# Patient Record
Sex: Male | Born: 1980
Health system: Southern US, Community
[De-identification: ages and names within clinical notes are randomized; demographics above are authoritative.]

## PROBLEM LIST (undated history)

## (undated) DIAGNOSIS — M545 Low back pain, unspecified: Secondary | ICD-10-CM

## (undated) DIAGNOSIS — M51369 Other intervertebral disc degeneration, lumbar region without mention of lumbar back pain or lower extremity pain: Secondary | ICD-10-CM

## (undated) DIAGNOSIS — R002 Palpitations: Secondary | ICD-10-CM

## (undated) DIAGNOSIS — Z87442 Personal history of urinary calculi: Secondary | ICD-10-CM

## (undated) DIAGNOSIS — E668 Other obesity: Secondary | ICD-10-CM

## (undated) DIAGNOSIS — G8929 Other chronic pain: Secondary | ICD-10-CM

## (undated) DIAGNOSIS — E785 Hyperlipidemia, unspecified: Secondary | ICD-10-CM

## (undated) DIAGNOSIS — K219 Gastro-esophageal reflux disease without esophagitis: Secondary | ICD-10-CM

## (undated) DIAGNOSIS — E669 Obesity, unspecified: Secondary | ICD-10-CM

## (undated) DIAGNOSIS — G4733 Obstructive sleep apnea (adult) (pediatric): Secondary | ICD-10-CM

## (undated) DIAGNOSIS — M5126 Other intervertebral disc displacement, lumbar region: Secondary | ICD-10-CM

## (undated) DIAGNOSIS — Z9989 Dependence on other enabling machines and devices: Secondary | ICD-10-CM

## (undated) DIAGNOSIS — M109 Gout, unspecified: Secondary | ICD-10-CM

## (undated) DIAGNOSIS — G43909 Migraine, unspecified, not intractable, without status migrainosus: Secondary | ICD-10-CM

## (undated) DIAGNOSIS — M5136 Other intervertebral disc degeneration, lumbar region: Secondary | ICD-10-CM

## (undated) DIAGNOSIS — I493 Ventricular premature depolarization: Secondary | ICD-10-CM

## (undated) DIAGNOSIS — J301 Allergic rhinitis due to pollen: Secondary | ICD-10-CM

## (undated) HISTORY — DX: Obstructive sleep apnea (adult) (pediatric): G47.33

## (undated) HISTORY — DX: Other intervertebral disc degeneration, lumbar region: M51.36

## (undated) HISTORY — DX: Low back pain: M54.5

## (undated) HISTORY — DX: Allergic rhinitis due to pollen: J30.1

## (undated) HISTORY — DX: Other obesity: E66.8

## (undated) HISTORY — DX: Low back pain, unspecified: M54.50

## (undated) HISTORY — DX: Other intervertebral disc degeneration, lumbar region without mention of lumbar back pain or lower extremity pain: M51.369

## (undated) HISTORY — DX: Other intervertebral disc displacement, lumbar region: M51.26

## (undated) HISTORY — PX: WISDOM TOOTH EXTRACTION: SHX21

## (undated) HISTORY — DX: Dependence on other enabling machines and devices: Z99.89

## (undated) HISTORY — DX: Other chronic pain: G89.29

## (undated) HISTORY — DX: Personal history of urinary calculi: Z87.442

## (undated) HISTORY — DX: Palpitations: R00.2

## (undated) HISTORY — DX: Gout, unspecified: M10.9

## (undated) HISTORY — DX: Obesity, unspecified: E66.9

## (undated) HISTORY — DX: Hyperlipidemia, unspecified: E78.5

---
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
## 2011-09-22 ENCOUNTER — Encounter: Payer: Self-pay | Admitting: Internal Medicine

## 2011-09-22 ENCOUNTER — Ambulatory Visit (INDEPENDENT_AMBULATORY_CARE_PROVIDER_SITE_OTHER): Payer: BC Managed Care – PPO | Admitting: Internal Medicine

## 2011-09-22 VITALS — BP 112/80 | HR 68 | Temp 98.2°F | Ht 69.0 in | Wt 326.0 lb

## 2011-09-22 DIAGNOSIS — Z23 Encounter for immunization: Secondary | ICD-10-CM

## 2011-09-22 DIAGNOSIS — M545 Low back pain, unspecified: Secondary | ICD-10-CM | POA: Insufficient documentation

## 2011-09-22 DIAGNOSIS — J301 Allergic rhinitis due to pollen: Secondary | ICD-10-CM

## 2011-09-22 DIAGNOSIS — E669 Obesity, unspecified: Secondary | ICD-10-CM | POA: Insufficient documentation

## 2011-09-22 DIAGNOSIS — G8929 Other chronic pain: Secondary | ICD-10-CM

## 2011-09-22 DIAGNOSIS — E785 Hyperlipidemia, unspecified: Secondary | ICD-10-CM

## 2011-09-22 NOTE — Progress Notes (Signed)
Subjective:    Patient ID: Elijah Logan, male    DOB: 11-21-1980, 31 y.o.   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 atorvastatin (LIPITOR) 20 MG tablet Take 20 mg by mouth daily.        No Known Allergies  Past Medical History  Diagnosis Date  . Hyperlipidemia   . Obesity   . Chronic low back pain     Has spina bifida occulta    Past Surgical History  Procedure Date  . Open anterior shoulder reconstruction 1998    Right    Family History  Problem Relation Age of Onset  . Diabetes Mother     borderline  . Diabetes Father     borderline  . Diabetes Maternal Grandmother   . Diabetes Paternal Grandmother   . Hypertension Other   . Stroke Other   . Cancer Other     prostate cancer    History   Social History  . Marital Status: Married    Spouse Name: N/A    Number of Children: 1  . Years of Education: N/A   Occupational History  . 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 Smoking status: Never Smoker   . Smokeless tobacco: Never Used  . Alcohol Use: Yes  . Drug Use: No  . Sexually Active: Not on file   Other Topics Concern  . Not on file   Social History Narrative  . No narrative on file   Review of Systems  Constitutional: Negative for fatigue.       Wears seat belt Weight down with efforts  HENT: Positive for  congestion and rhinorrhea. Negative for hearing loss.        Occ very mild allergy symptoms Uses otc loratadine prn  Eyes: Negative for visual disturbance.  Respiratory: Negative for cough, chest tightness and shortness of breath.   Cardiovascular: Negative for chest pain, palpitations and leg swelling.  Gastrointestinal: Negative for abdominal pain, constipation and blood in stool.  Genitourinary: Negative for frequency and difficulty urinating.  Neurological: Positive for headaches. Negative for dizziness, syncope and light-headedness.       Rare migraines History of concussions while he played football  Psychiatric/Behavioral: Positive for sleep disturbance.       Wife notes increased snoring and is concerned that he has some apnea Tired in AM after 6-7 hours of sleep--feels good after 30 minutes or so Doesn't note sleep pressure       Objective:   Physical Exam  Constitutional: He appears well-developed and well-nourished. No distress.  Neck: Normal range of motion. Neck supple. No thyromegaly present.  Cardiovascular: Normal rate, regular rhythm, normal heart sounds and intact distal pulses.  Exam reveals no gallop.   No murmur heard. Pulmonary/Chest: Effort normal and breath sounds normal. No respiratory distress. He has no wheezes. He has no rales.  Abdominal: Soft. There  is no tenderness.  Musculoskeletal: He exhibits no edema and no tenderness.  Lymphadenopathy:    He has no cervical adenopathy.  Skin: No rash noted. No erythema.       Prominent scar at biopsy site on right arm  Psychiatric: He has a normal mood and affect. His behavior is normal. Thought content normal.          Assessment & Plan:

## 2011-09-22 NOTE — Assessment & Plan Note (Signed)
Discussed concerns about DM given FH He has started weight watchers and more exercise

## 2011-09-22 NOTE — Assessment & Plan Note (Signed)
Has been okay of late Will be working on better fitness

## 2011-09-22 NOTE — Assessment & Plan Note (Signed)
Mild does well with OTC loratadine

## 2011-09-22 NOTE — Assessment & Plan Note (Addendum)
Discussed primary prevention Has worked on diet and exercise No compelling FH of CAD After going over pros and cons---will stop the atorvastatin for now  Prior records reviewed

## 2012-09-28 ENCOUNTER — Encounter: Payer: BC Managed Care – PPO | Admitting: Internal Medicine

## 2012-09-29 ENCOUNTER — Ambulatory Visit (INDEPENDENT_AMBULATORY_CARE_PROVIDER_SITE_OTHER): Payer: BC Managed Care – PPO | Admitting: Internal Medicine

## 2012-09-29 ENCOUNTER — Encounter: Payer: Self-pay | Admitting: Internal Medicine

## 2012-09-29 VITALS — BP 118/70 | HR 82 | Temp 97.9°F | Ht 70.0 in | Wt 330.0 lb

## 2012-09-29 DIAGNOSIS — L909 Atrophic disorder of skin, unspecified: Secondary | ICD-10-CM

## 2012-09-29 DIAGNOSIS — G4730 Sleep apnea, unspecified: Secondary | ICD-10-CM

## 2012-09-29 DIAGNOSIS — L918 Other hypertrophic disorders of the skin: Secondary | ICD-10-CM | POA: Insufficient documentation

## 2012-09-29 DIAGNOSIS — G4733 Obstructive sleep apnea (adult) (pediatric): Secondary | ICD-10-CM | POA: Insufficient documentation

## 2012-09-29 DIAGNOSIS — Z0000 Encounter for general adult medical examination without abnormal findings: Secondary | ICD-10-CM | POA: Insufficient documentation

## 2012-09-29 NOTE — Assessment & Plan Note (Signed)
Wife has noted cessation of breathing Will make referral for evaluation

## 2012-09-29 NOTE — Assessment & Plan Note (Signed)
30 seconds x 2 for each 45 seconds x 2 for the elbow warts Discussed home care

## 2012-09-29 NOTE — Assessment & Plan Note (Signed)
Healthy but out of shape still Discussed fitness DASH diet Reviewed full labs from work ---all fine

## 2012-09-29 NOTE — Patient Instructions (Signed)
DASH Diet  The DASH diet stands for "Dietary Approaches to Stop Hypertension." It is a healthy eating plan that has been shown to reduce high blood pressure (hypertension) in as little as 14 days, while also possibly providing other significant health benefits. These other health benefits include reducing the risk of breast cancer after menopause and reducing the risk of type 2 diabetes, heart disease, colon cancer, and stroke. Health benefits also include weight loss and slowing kidney failure in patients with chronic kidney disease.   DIET GUIDELINES  · Limit salt (sodium). Your diet should contain less than 1500 mg of sodium daily.  · Limit refined or processed carbohydrates. Your diet should include mostly whole grains. Desserts and added sugars should be used sparingly.  · Include small amounts of heart-healthy fats. These types of fats include nuts, oils, and tub margarine. Limit saturated and trans fats. These fats have been shown to be harmful in the body.  CHOOSING FOODS   The following food groups are based on a 2000 calorie diet. See your Registered Dietitian for individual calorie needs.  Grains and Grain Products (6 to 8 servings daily)  · Eat More Often: Whole-wheat bread, brown rice, whole-grain or wheat pasta, quinoa, popcorn without added fat or salt (air popped).  · Eat Less Often: White bread, white pasta, white rice, cornbread.  Vegetables (4 to 5 servings daily)  · Eat More Often: Fresh, frozen, and canned vegetables. Vegetables may be raw, steamed, roasted, or grilled with a minimal amount of fat.  · Eat Less Often/Avoid: Creamed or fried vegetables. Vegetables in a cheese sauce.  Fruit (4 to 5 servings daily)  · Eat More Often: All fresh, canned (in natural juice), or frozen fruits. Dried fruits without added sugar. One hundred percent fruit juice (½ cup [237 mL] daily).  · Eat Less Often: Dried fruits with added sugar. Canned fruit in light or heavy syrup.  Lean Meats, Fish, and Poultry (2  servings or less daily. One serving is 3 to 4 oz [85-114 g]).  · Eat More Often: Ninety percent or leaner ground beef, tenderloin, sirloin. Round cuts of beef, chicken breast, turkey breast. All fish. Grill, bake, or broil your meat. Nothing should be fried.  · Eat Less Often/Avoid: Fatty cuts of meat, turkey, or chicken leg, thigh, or wing. Fried cuts of meat or fish.  Dairy (2 to 3 servings)  · Eat More Often: Low-fat or fat-free milk, low-fat plain or light yogurt, reduced-fat or part-skim cheese.  · Eat Less Often/Avoid: Milk (whole, 2%). Whole milk yogurt. Full-fat cheeses.  Nuts, Seeds, and Legumes (4 to 5 servings per week)  · Eat More Often: All without added salt.  · Eat Less Often/Avoid: Salted nuts and seeds, canned beans with added salt.  Fats and Sweets (limited)  · Eat More Often: Vegetable oils, tub margarines without trans fats, sugar-free gelatin. Mayonnaise and salad dressings.  · Eat Less Often/Avoid: Coconut oils, palm oils, butter, stick margarine, cream, half and half, cookies, candy, pie.  FOR MORE INFORMATION  The Dash Diet Eating Plan: www.dashdiet.org  Document Released: 04/08/2011 Document Revised: 07/12/2011 Document Reviewed: 04/08/2011  ExitCare® Patient Information ©2014 [employer].

## 2012-09-29 NOTE — Progress Notes (Signed)
Subjective:    Patient ID: Elijah Logan, male    DOB: 1980-08-24, 32 y.o.   MRN: [id number]  HPI Here for physical Still with [employer] Just finished building a house Dad just had major MI-- got stents at [facility] (needed to be defibrillated) Different job position with more stress---now trying to reduce time at work  Wife concerned about sleep apnea He feels he sleeps fine She notes some apnea when on his back Does feel tired in the daytime at times---doesn't fall asleep at desk and no sig sleep pressure in car Some snoring but no gasping  Current Outpatient Prescriptions on File Prior to Visit  Medication Sig Dispense Refill  . FIBER FORMULA CAPS Take 2 capsules by mouth daily.      . Omega-3 Fatty Acids (FISH OIL) 1200 MG CAPS Take 3 capsules by mouth daily.       No current facility-administered medications on file prior to visit.    No Known Allergies  Past Medical History  Diagnosis Date  . Hyperlipidemia   . Obesity   . Chronic low back pain     Has spina bifida occulta  . Allergic rhinitis due to pollen     mild    Past Surgical History  Procedure Laterality Date  . Open anterior shoulder reconstruction  1998    Right    Family History  Problem Relation Age of Onset  . Diabetes Mother     borderline  . Diabetes Father     borderline  . Heart disease Father     MI and needed defibrillation  . Diabetes Maternal Grandmother   . Diabetes Paternal Grandmother   . Hypertension Other   . Stroke Other   . Cancer Other     prostate cancer    History   Social History  . Marital Status: Married    Spouse Name: N/A    Number of Children: 1  . Years of Education: N/A   Occupational History  . 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 Smoking status: Never Smoker   . Smokeless tobacco: Never Used  . Alcohol Use: Yes  . Drug Use: No  . Sexually Active: Not on file    Other Topics Concern  . Not on file   Social History Narrative  . No narrative on file   Review of Systems  Constitutional: Negative for unexpected weight change.       Wears seat belt  HENT: Positive for congestion and rhinorrhea. Negative for hearing loss, dental problem and tinnitus.        Regular with dentist  Eyes: Negative for visual disturbance.       No diplopia or unilateral vision loss  Gastrointestinal: Negative for nausea, vomiting, abdominal pain, constipation and blood in stool.       Gets a lot of gas Bowels regular--may be frequent. ??lactose intolerance  Endocrine: Negative for cold intolerance and heat intolerance.  Musculoskeletal: Positive for back pain and arthralgias. Negative for joint swelling.       Some right knee pain after a minor injury--seems to have improved  Skin: Negative for rash.       Red mole on right arm Some skin tags  Allergic/Immunologic: Positive for environmental allergies. Negative for immunocompromised state.       Worse troubles in rental home---loratadine helps  Neurological: Positive for numbness. Negative for dizziness,  syncope, weakness, light-headedness and headaches.       Occ brief right upper buttock numbness--relates to back  Psychiatric/Behavioral: Positive for sleep disturbance. Negative for dysphoric mood. The patient is nervous/anxious.        Stress with work       Objective:   Physical Exam  Constitutional: He is oriented to person, place, and time. He appears well-developed and well-nourished. No distress.  HENT:  Head: Normocephalic and atraumatic.  Right Ear: External ear normal.  Left Ear: External ear normal.  Mouth/Throat: Oropharynx is clear and moist. No oropharyngeal exudate.  Eyes: Conjunctivae and EOM are normal. Pupils are equal, round, and reactive to light.  Neck: Normal range of motion. Neck supple. No thyromegaly present.  Cardiovascular: Normal rate, regular rhythm, normal heart sounds and  intact distal pulses.  Exam reveals no gallop.   No murmur heard. Pulmonary/Chest: Effort normal and breath sounds normal. No respiratory distress. He has no wheezes. He has no rales.  Abdominal: Soft. Bowel sounds are normal. There is no tenderness.  Musculoskeletal: He exhibits no edema and no tenderness.  Lymphadenopathy:    He has no cervical adenopathy.  Neurological: He is alert and oriented to person, place, and time.  Skin: No rash noted.  Small skin tags on right neck and left axilla 2 warts on left elbow  Psychiatric: He has a normal mood and affect. His behavior is normal.          Assessment & Plan:

## 2012-10-23 ENCOUNTER — Institutional Professional Consult (permissible substitution): Payer: BC Managed Care – PPO | Admitting: Pulmonary Disease

## 2012-10-30 ENCOUNTER — Encounter: Payer: Self-pay | Admitting: Pulmonary Disease

## 2012-10-30 ENCOUNTER — Ambulatory Visit (INDEPENDENT_AMBULATORY_CARE_PROVIDER_SITE_OTHER): Payer: BC Managed Care – PPO | Admitting: Pulmonary Disease

## 2012-10-30 VITALS — BP 132/84 | HR 89 | Temp 97.6°F | Ht 71.0 in | Wt 332.0 lb

## 2012-10-30 DIAGNOSIS — G4730 Sleep apnea, unspecified: Secondary | ICD-10-CM

## 2012-10-30 NOTE — Patient Instructions (Addendum)
Will schedule for home sleep testing, and will arrange followup once the results are available.  Work on weight loss  

## 2012-10-30 NOTE — Progress Notes (Signed)
Subjective:    Patient ID: Elijah Logan, male    DOB: [birth year], 32 y.o.   MRN: [id number]  HPI The patient is a 32 year old male who I have been asked to see for possible obstructive sleep apnea.  The patient has been noted by his wife to have snoring, as well as his abnormal breathing pattern during sleep.  The patient denies frequent awakenings, but is only rested about 50% of the mornings when he arises.  He does have daytime sleep pressure with periods of inactivity, but this primarily occurs in the afternoons.  He also will fall asleep easily in the evenings watching television or movies.  He denies any sleepiness issues with driving.  The patient's weight is up 25 pounds over the last 2 years, and his epworth score today is 3.    Sleep Questionnaire What time do you typically go to bed?( Between what hours) 10-11 10-11 at 0900 on 10/30/12 by Nita Sells, CMA How long does it take you to fall asleep? 10-37mins 10-85mins at 0900 on 10/30/12 by Nita Sells, CMA How many times during the night do you wake up? 1 1 at 0900 on 10/30/12 by Nita Sells, CMA What time do you get out of bed to start your day? 0600 0600 at 0900 on 10/30/12 by Nita Sells, CMA Do you drive or operate heavy machinery in your occupation? No No at 0900 on 10/30/12 by Nita Sells, CMA How much has your weight changed (up or down) over the past two years? (In pounds) 25 lb (11.34 kg)25 lb (11.34 kg) increase at 0900 on 10/30/12 by Nita Sells, CMA Have you ever had a sleep study before? No No at 0900 on 10/30/12 by Marjo Bicker Mabe, CMA Do you currently use CPAP? No No at 0900 on 10/30/12 by Marjo Bicker Mabe, CMA Do you wear oxygen at any time? No No at 0900 on 10/30/12 by Marjo Bicker Mabe, CMA   Review of Systems  Constitutional: Positive for unexpected weight change. Negative for fever.  HENT: Positive for congestion and sneezing. Negative for ear pain, nosebleeds, sore throat, rhinorrhea, trouble  swallowing, dental problem, postnasal drip and sinus pressure.   Eyes: Negative for redness and itching.  Respiratory: Negative for cough, chest tightness, shortness of breath and wheezing.   Cardiovascular: Negative for palpitations and leg swelling.  Gastrointestinal: Negative for nausea and vomiting.       Indigestion  Genitourinary: Negative for dysuria.  Musculoskeletal: Positive for joint swelling and arthralgias.  Skin: Negative for rash.  Neurological: Negative for headaches.  Hematological: Does not bruise/bleed easily.  Psychiatric/Behavioral: Negative for dysphoric mood. The patient is nervous/anxious.        Objective:   Physical Exam Constitutional:  Morbidly obese male , no acute distress  HENT:  Nares patent without discharge  Oropharynx without exudate, palate and uvula are mildly elongated.   Eyes:  Perrla, eomi, no scleral icterus  Neck:  No JVD, no TMG  Cardiovascular:  Normal rate, regular rhythm, no rubs or gallops.  No murmurs        Intact distal pulses  Pulmonary :  Normal breath sounds, no stridor or respiratory distress   No rales, rhonchi, or wheezing  Abdominal:  Soft, nondistended, bowel sounds present.  No tenderness noted.   Musculoskeletal:  No lower extremity edema noted.  Lymph Nodes:  No cervical lymphadenopathy noted  Skin:  No cyanosis noted  Neurologic:  Alert, appropriate, moves all 4 extremities  without obvious deficit.         Assessment & Plan:

## 2012-10-30 NOTE — Assessment & Plan Note (Signed)
The patient's history is very suggestive of obstructive sleep apnea.  He has been noted to have mild snoring, witnessed apneas, and he clearly has nonrestorative sleep.  He is morbidly obese, and has some degree of daytime sleepiness.  At this point, I have recommended that he have a sleep test done, and the patient is agreeable.  Because of his age and lack of comorbid medical issues, I think he would be a good candidate for home sleep testing.

## 2012-11-30 DIAGNOSIS — R0989 Other specified symptoms and signs involving the circulatory and respiratory systems: Secondary | ICD-10-CM

## 2012-11-30 DIAGNOSIS — R0609 Other forms of dyspnea: Secondary | ICD-10-CM

## 2012-11-30 DIAGNOSIS — G479 Sleep disorder, unspecified: Secondary | ICD-10-CM

## 2012-12-15 ENCOUNTER — Telehealth: Payer: Self-pay | Admitting: Pulmonary Disease

## 2012-12-15 ENCOUNTER — Encounter: Payer: Self-pay | Admitting: Pulmonary Disease

## 2012-12-15 DIAGNOSIS — I498 Other specified cardiac arrhythmias: Secondary | ICD-10-CM

## 2012-12-15 DIAGNOSIS — R000 Tachycardia, unspecified: Secondary | ICD-10-CM

## 2012-12-15 DIAGNOSIS — G4733 Obstructive sleep apnea (adult) (pediatric): Secondary | ICD-10-CM

## 2012-12-15 NOTE — Telephone Encounter (Signed)
Elijah Logan, this pt needs ov with me soon to f/u on his sleep study.

## 2012-12-15 NOTE — Telephone Encounter (Signed)
Unable to leave voicemail d/t voicemail being full. LMOM with wife Vicky(EC) When patient calls, please schedule in FIRST AVAILABLE SLOT OR FIRST 430 SLOT

## 2012-12-19 NOTE — Telephone Encounter (Signed)
Pt's wife returned call and scheduled appt for pt 12/29/12 @ 3:30.  Nothing further needed. Leanora Ivanoff

## 2012-12-29 ENCOUNTER — Encounter: Payer: Self-pay | Admitting: Pulmonary Disease

## 2012-12-29 ENCOUNTER — Ambulatory Visit (INDEPENDENT_AMBULATORY_CARE_PROVIDER_SITE_OTHER): Payer: BC Managed Care – PPO | Admitting: Pulmonary Disease

## 2012-12-29 VITALS — BP 122/86 | HR 86 | Temp 98.3°F | Ht 70.5 in | Wt 328.8 lb

## 2012-12-29 DIAGNOSIS — G4733 Obstructive sleep apnea (adult) (pediatric): Secondary | ICD-10-CM

## 2012-12-29 NOTE — Progress Notes (Signed)
  Subjective:    Patient ID: Elijah Logan, male    DOB: 09-08-1980, 32 y.o.   MRN: [id number]  HPI Patient comes in today for followup after his recent home sleep testing.  He was found to have severe obstructive sleep apnea with an AHI of 69 events per hour.  I have reviewed the study with him in detail, and answered all of his questions.   Review of Systems  Constitutional: Negative for fever and unexpected weight change.  HENT: Negative for ear pain, nosebleeds, congestion, sore throat, rhinorrhea, sneezing, trouble swallowing, dental problem, postnasal drip and sinus pressure.   Eyes: Negative for redness and itching.  Respiratory: Negative for cough, chest tightness, shortness of breath and wheezing.   Cardiovascular: Negative for palpitations and leg swelling.  Gastrointestinal: Negative for nausea and vomiting.  Genitourinary: Negative for dysuria.  Musculoskeletal: Negative for joint swelling.  Skin: Negative for rash.  Neurological: Negative for headaches.  Hematological: Does not bruise/bleed easily.  Psychiatric/Behavioral: Negative for dysphoric mood. The patient is not nervous/anxious.        Objective:   Physical Exam Morbidly obese male in no acute distress Nose without purulence or discharge noted Neck without lymphadenopathy or thyromegaly Lower extremities with minimal edema, no cyanosis Alert and oriented, moves all 4 extremities.       Assessment & Plan:

## 2012-12-29 NOTE — Assessment & Plan Note (Signed)
The patient has severe obstructive sleep apnea, and will need aggressive treatment with CPAP while he is working on weight loss.  The patient is agreeable to this approach. I will set the patient up on cpap at a moderate pressure level to allow for desensitization, and will troubleshoot the device over the next 4-6weeks if needed.  The pt is to call me if having issues with tolerance.  Will then optimize the pressure once patient is able to wear cpap on a consistent basis.

## 2012-12-29 NOTE — Patient Instructions (Addendum)
Will start you on cpap at a moderate pressure level.  Please call if having issues with tolerance. Work on weight loss followup with me in 6 weeks.

## 2013-02-09 ENCOUNTER — Ambulatory Visit: Payer: BC Managed Care – PPO | Admitting: Pulmonary Disease

## 2013-03-08 ENCOUNTER — Other Ambulatory Visit: Payer: Self-pay

## 2013-03-16 ENCOUNTER — Ambulatory Visit: Payer: BC Managed Care – PPO | Admitting: Pulmonary Disease

## 2013-10-05 ENCOUNTER — Encounter: Payer: BC Managed Care – PPO | Admitting: Internal Medicine

## 2013-10-17 ENCOUNTER — Encounter: Payer: BC Managed Care – PPO | Admitting: Internal Medicine

## 2013-10-22 ENCOUNTER — Encounter: Payer: Self-pay | Admitting: Internal Medicine

## 2013-10-22 ENCOUNTER — Ambulatory Visit (INDEPENDENT_AMBULATORY_CARE_PROVIDER_SITE_OTHER): Payer: BC Managed Care – PPO | Admitting: Internal Medicine

## 2013-10-22 VITALS — BP 120/76 | HR 72 | Temp 98.3°F | Ht 70.0 in | Wt 340.0 lb

## 2013-10-22 DIAGNOSIS — E785 Hyperlipidemia, unspecified: Secondary | ICD-10-CM

## 2013-10-22 DIAGNOSIS — Z0000 Encounter for general adult medical examination without abnormal findings: Secondary | ICD-10-CM

## 2013-10-22 LAB — CBC
HEMATOCRIT: 44.5 % (ref 39.0–52.0)
Hemoglobin: 15 g/dL (ref 13.0–17.0)
MCHC: 33.7 g/dL (ref 30.0–36.0)
MCV: 86.5 fl (ref 78.0–100.0)
Platelets: 235 10*3/uL (ref 150.0–400.0)
RBC: 5.15 Mil/uL (ref 4.22–5.81)
RDW: 13.1 % (ref 11.5–15.5)
WBC: 6.3 10*3/uL (ref 4.0–10.5)

## 2013-10-22 LAB — COMPREHENSIVE METABOLIC PANEL
ALBUMIN: 4.3 g/dL (ref 3.5–5.2)
ALT: 47 U/L (ref 0–53)
AST: 23 U/L (ref 0–37)
Alkaline Phosphatase: 49 U/L (ref 39–117)
BILIRUBIN TOTAL: 0.6 mg/dL (ref 0.2–1.2)
BUN: 14 mg/dL (ref 6–23)
CO2: 30 meq/L (ref 19–32)
Calcium: 9.1 mg/dL (ref 8.4–10.5)
Chloride: 106 mEq/L (ref 96–112)
Creatinine, Ser: 0.9 mg/dL (ref 0.4–1.5)
GFR: 102.24 mL/min (ref >60.00)
GLUCOSE: 99 mg/dL (ref 70–99)
Potassium: 4.8 mEq/L (ref 3.5–5.1)
Sodium: 141 mEq/L (ref 135–145)
TOTAL PROTEIN: 7.2 g/dL (ref 6.0–8.3)

## 2013-10-22 LAB — LIPID PANEL
CHOLESTEROL: 184 mg/dL (ref 0–200)
HDL: 34.9 mg/dL — ABNORMAL LOW (ref >39.00)
LDL Cholesterol: 119 mg/dL — ABNORMAL HIGH (ref 0–99)
NONHDL: 149.1
Total CHOL/HDL Ratio: 5
Triglycerides: 149 mg/dL (ref 0.0–149.0)
VLDL: 29.8 mg/dL (ref 0.0–40.0)

## 2013-10-22 LAB — HEMOGLOBIN A1C: HEMOGLOBIN A1C: 5.8 % (ref 4.6–6.5)

## 2013-10-22 NOTE — Progress Notes (Signed)
Subjective:    Patient ID: Elijah Logan, male    DOB: 25-Jun-1980, 33 y.o.   MRN: [id number]  HPI  Pt presents to the clinic today for his annual exam. He has no concerns today.  He does note that his weight has gone up about 12 pounds. He is not happy about this. He does not exercise. He does eat a lot of fast food. He has tried weight watchers in the past.  Flu: 02/2012 Tetanus: 09/22/2011 Dentist: [facility]  Review of Systems      Past Medical History  Diagnosis Date  . Hyperlipidemia   . Obesity   . Chronic low back pain     Has spina bifida occulta  . Allergic rhinitis due to pollen     mild    Current Outpatient Prescriptions  Medication Sig Dispense Refill  . FIBER FORMULA CAPS Take 2 capsules by mouth daily.      . Loratadine (CLARITIN) 10 MG CAPS Take 10 mg by mouth daily.      . Omega-3 Fatty Acids (FISH OIL) 1200 MG CAPS Take 3 capsules by mouth daily.       No current facility-administered medications for this visit.    No Known Allergies  Family History  Problem Relation Age of Onset  . Diabetes Mother     borderline  . Diabetes Father     borderline  . Heart disease Father     MI and needed defibrillation  . Diabetes Maternal Grandmother   . Diabetes Paternal Grandmother   . Hypertension Other   . Stroke Other   . Cancer Other     prostate cancer    History   Social History  . Marital Status: Married    Spouse Name: N/A    Number of Children: 1  . Years of Education: N/A   Occupational History  . 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 Smoking status: Never Smoker   . Smokeless tobacco: Never Used  . Alcohol Use: Yes     Comment: 0-1week  . Drug Use: No  . Sexual Activity: Not on file   Other Topics Concern  . Not on file   Social History Narrative  . No narrative on file     Constitutional: Denies fever, malaise, fatigue, headache or  abrupt weight changes.  HEENT: Denies eye pain, eye redness, ear pain, ringing in the ears, wax buildup, runny nose, nasal congestion, bloody nose, or sore throat. Respiratory: Pt reports shortness of breath. Denies difficulty breathing, cough or sputum production.   Cardiovascular: Denies chest pain, chest tightness, palpitations or swelling in the hands or feet.  Gastrointestinal: Denies abdominal pain, bloating, constipation, diarrhea or blood in the stool.  GU: Denies urgency, frequency, pain with urination, burning sensation, blood in urine, odor or discharge. Musculoskeletal: Pt reports back pain. Denies decrease in range of motion, difficulty with gait, muscle pain or joint swelling.  Skin: Denies redness, rashes, lesions or ulcercations.  Neurological: Denies dizziness, difficulty with memory, difficulty with speech or problems with balance and coordination.   No other specific complaints in a complete review of systems (except as listed in HPI above).  Objective:   Physical Exam   BP 120/76  Pulse 72  Temp(Src) 98.3 F (36.8 C) (Oral)  Ht 5\' 10"  (1.778 m)  Wt 340 lb (154.223 kg)  BMI 48.78 kg/m2 Wt Readings from  Last 3 Encounters:  10/22/13 340 lb (154.223 kg)  12/29/12 328 lb 12.8 oz (149.143 kg)  10/30/12 332 lb (150.594 kg)    General: Appears his stated age, obese but well developed, well nourished in NAD. Skin: Warm, dry and intact. No rashes, lesions or ulcerations noted. HEENT: Head: normal shape and size; Eyes: sclera white, no icterus, conjunctiva pink, PERRLA and EOMs intact; Ears: Tm's gray and intact, normal light reflex; Nose: mucosa pink and moist, septum midline; Throat/Mouth: Teeth present, mucosa pink and moist, no exudate, lesions or ulcerations noted.  Neck: Normal range of motion. Neck supple, trachea midline. No massses, lumps or thyromegaly present.  Cardiovascular: Normal rate and rhythm. S1,S2 noted.  No murmur, rubs or gallops noted. No JVD or BLE  edema. No carotid bruits noted. Pulmonary/Chest: Normal effort and positive vesicular breath sounds. No respiratory distress. No wheezes, rales or ronchi noted.  Abdomen: Soft and nontender. Normal bowel sounds, no bruits noted. No distention or masses noted. Liver, spleen and kidneys non palpable. Musculoskeletal: Normal flexion and extension of the back. Tender to palpation in the lumbar spine.  No difficulty with gait.  Neurological: Alert and oriented. Cranial nerves II-XII intact. Coordination normal. +DTRs bilaterally. Psychiatric: Mood and affect normal. Behavior is normal. Judgment and thought content normal.          Assessment & Plan:   Preventative Health Maintenance:  All HM UTD Will obtain CBC, CMET, Lipid and A1C today  RTC in 1 year or sooner if needed

## 2013-10-22 NOTE — Assessment & Plan Note (Addendum)
Discussed health food choice, more fruit and veggies, low carbs Cutting out fast food and diet drinks Also discussed proper portion sizes  Encouraged him to download myfitness pal app to keep track of calories  Of note, I think his SOB is related to his weight gain. Discussed how weight affects breathing. He understands and agrees.

## 2013-10-22 NOTE — Patient Instructions (Addendum)
Health Maintenance A healthy lifestyle and preventative care can promote health and wellness.  Maintain regular health, dental, and eye exams.  Eat a healthy diet. Foods like vegetables, fruits, whole grains, low-fat dairy products, and lean protein foods contain the nutrients you need and are low in calories. Decrease your intake of foods high in solid fats, added sugars, and salt. Get information about a proper diet from your health care Carol Theys, if necessary.  Regular physical exercise is one of the most important things you can do for your health. Most adults should get at least 150 minutes of moderate-intensity exercise (any activity that increases your heart rate and causes you to sweat) each week. In addition, most adults need muscle-strengthening exercises on 2 or more days a week.   Maintain a healthy weight. The body mass index (BMI) is a screening tool to identify possible weight problems. It provides an estimate of body fat based on height and weight. Your health care Alyx Gee can find your BMI and can help you achieve or maintain a healthy weight. For males 20 years and older:  A BMI below 18.5 is considered underweight.  A BMI of 18.5 to 24.9 is normal.  A BMI of 25 to 29.9 is considered overweight.  A BMI of 30 and above is considered obese.  Maintain normal blood lipids and cholesterol by exercising and minimizing your intake of saturated fat. Eat a balanced diet with plenty of fruits and vegetables. Blood tests for lipids and cholesterol should begin at age 20 and be repeated every 5 years. If your lipid or cholesterol levels are high, you are over age 50, or you are at high risk for heart disease, you may need your cholesterol levels checked more frequently.Ongoing high lipid and cholesterol levels should be treated with medicines if diet and exercise are not working.  If you smoke, find out from your health care Kuper Rennels how to quit. If you do not use tobacco, do not  start.  Lung cancer screening is recommended for adults aged 55-80 years who are at high risk for developing lung cancer because of a history of smoking. A yearly low-dose CT scan of the lungs is recommended for people who have at least a 30-pack-year history of smoking and are current smokers or have quit within the past 15 years. A pack year of smoking is smoking an average of 1 pack of cigarettes a day for 1 year (for example, a 30-pack-year history of smoking could mean smoking 1 pack a day for 30 years or 2 packs a day for 15 years). Yearly screening should continue until the smoker has stopped smoking for at least 15 years. Yearly screening should be stopped for people who develop a health problem that would prevent them from having lung cancer treatment.  If you choose to drink alcohol, do not have more than 2 drinks per day. One drink is considered to be 12 oz (360 mL) of beer, 5 oz (150 mL) of wine, or 1.5 oz (45 mL) of liquor.  Avoid the use of street drugs. Do not share needles with anyone. Ask for help if you need support or instructions about stopping the use of drugs.  High blood pressure causes heart disease and increases the risk of stroke. Blood pressure should be checked at least every 1-2 years. Ongoing high blood pressure should be treated with medicines if weight loss and exercise are not effective.  If you are 45-79 years old, ask your health care Graelyn Bihl if   you should take aspirin to prevent heart disease.  Diabetes screening involves taking a blood sample to check your fasting blood sugar level. This should be done once every 3 years after age 45 if you are at a normal weight and without risk factors for diabetes. Testing should be considered at a younger age or be carried out more frequently if you are overweight and have at least 1 risk factor for diabetes.  Colorectal cancer can be detected and often prevented. Most routine colorectal cancer screening begins at the age of 50  and continues through age 75. However, your health care Chidi Shirer may recommend screening at an earlier age if you have risk factors for colon cancer. On a yearly basis, your health care Avika Carbine may provide home test kits to check for hidden blood in the stool. A small camera at the end of a tube may be used to directly examine the colon (sigmoidoscopy or colonoscopy) to detect the earliest forms of colorectal cancer. Talk to your health care Pal Shell about this at age 50 when routine screening begins. A direct exam of the colon should be repeated every 5-10 years through age 75, unless early forms of precancerous polyps or small growths are found.  People who are at an increased risk for hepatitis B should be screened for this virus. You are considered at high risk for hepatitis B if:  You were born in a country where hepatitis B occurs often. Talk with your health care Piera Downs about which countries are considered high risk.  Your parents were born in a high-risk country and you have not received a shot to protect against hepatitis B (hepatitis B vaccine).  You have HIV or AIDS.  You use needles to inject street drugs.  You live with, or have sex with, someone who has hepatitis B.  You are a man who has sex with other men (MSM).  You get hemodialysis treatment.  You take certain medicines for conditions like cancer, organ transplantation, and autoimmune conditions.  Hepatitis C blood testing is recommended for all people born from 1945 through 1965 and any individual with known risk factors for hepatitis C.  Healthy men should no longer receive prostate-specific antigen (PSA) blood tests as part of routine cancer screening. Talk to your health care Satara Virella about prostate cancer screening.  Testicular cancer screening is not recommended for adolescents or adult males who have no symptoms. Screening includes self-exam, a health care Carizma Dunsworth exam, and other screening tests. Consult with your  health care Simra Fiebig about any symptoms you have or any concerns you have about testicular cancer.  Practice safe sex. Use condoms and avoid high-risk sexual practices to reduce the spread of sexually transmitted infections (STIs).  You should be screened for STIs, including gonorrhea and chlamydia if:  You are sexually active and are younger than 24 years.  You are older than 24 years, and your health care Abra Lingenfelter tells you that you are at risk for this type of infection.  Your sexual activity has changed since you were last screened, and you are at an increased risk for chlamydia or gonorrhea. Ask your health care Derrien Anschutz if you are at risk.  If you are at risk of being infected with HIV, it is recommended that you take a prescription medicine daily to prevent HIV infection. This is called pre-exposure prophylaxis (PrEP). You are considered at risk if:  You are a man who has sex with other men (MSM).  You are a heterosexual man who   is sexually active with multiple partners.  You take drugs by injection.  You are sexually active with a partner who has HIV.  Talk with your health care Hayes Rehfeldt about whether you are at high risk of being infected with HIV. If you choose to begin PrEP, you should first be tested for HIV. You should then be tested every 3 months for as long as you are taking PrEP.  Use sunscreen. Apply sunscreen liberally and repeatedly throughout the day. You should seek shade when your shadow is shorter than you. Protect yourself by wearing long sleeves, pants, a wide-brimmed hat, and sunglasses year round whenever you are outdoors.  Tell your health care Marisel Tostenson of new moles or changes in moles, especially if there is a change in shape or color. Also, tell your health care Kymir Coles if a mole is larger than the size of a pencil eraser.  A one-time screening for abdominal aortic aneurysm (AAA) and surgical repair of large AAAs by ultrasound is recommended for men aged  65-75 years who are current or former smokers.  Stay current with your vaccines (immunizations). Document Released: 10/16/2007 Document Revised: 04/24/2013 Document Reviewed: 09/14/2010 Exit[employer] Patient Information 2015 [employer]. This information is not intended to replace advice given to you by your health care Dereona Kolodny. Make sure you discuss any questions you have with your health care Leshea Jaggers.  

## 2013-10-22 NOTE — Progress Notes (Signed)
Pre visit review using our clinic review tool, if applicable. No additional management support is needed unless otherwise documented below in the visit note. 

## 2013-11-28 ENCOUNTER — Encounter: Payer: Self-pay | Admitting: Family Medicine

## 2013-11-28 ENCOUNTER — Telehealth: Payer: Self-pay | Admitting: Internal Medicine

## 2013-11-28 ENCOUNTER — Ambulatory Visit (INDEPENDENT_AMBULATORY_CARE_PROVIDER_SITE_OTHER): Payer: BC Managed Care – PPO | Admitting: Family Medicine

## 2013-11-28 VITALS — BP 124/86 | HR 76 | Temp 98.5°F | Ht 70.0 in | Wt 340.5 lb

## 2013-11-28 DIAGNOSIS — R6889 Other general symptoms and signs: Secondary | ICD-10-CM

## 2013-11-28 DIAGNOSIS — R0789 Other chest pain: Secondary | ICD-10-CM

## 2013-11-28 DIAGNOSIS — R002 Palpitations: Secondary | ICD-10-CM

## 2013-11-28 NOTE — Assessment & Plan Note (Signed)
Exertional in pt with risk factors  Reassuring EKG today  Ref to cardiol

## 2013-11-28 NOTE — Assessment & Plan Note (Signed)
With stairs primarily Pt is obese-trying to loose wt Chest discomfort on exertion as well Ref to cardiol

## 2013-11-28 NOTE — Telephone Encounter (Signed)
Patient Information:  Caller Name: Shaquelle  Phone: [phone]  Patient: Elijah Logan  Gender: Male  DOB: 1980-12-10  Age: 33 Years  PCP: Viviana Simpler [facility])  Office Follow Up:  Does the office need to follow up with this patient?: No  Instructions For The Office: N/A  RN Note:  Office is open. Scheduled appointment vs sending pt to the ER  Symptoms  Reason For Call & Symptoms: Chest palpitations when exerting himself. Recently had vital signs taken for work and everything was normal at that time. Feels fluttering at times during the day, but nothing constant. When walking up stairs at work chest feels tight, but with rest this sensation gets better.  Reviewed Health History In EMR: Yes  Reviewed Medications In EMR: Yes  Reviewed Allergies In EMR: Yes  Reviewed Surgeries / Procedures: Yes  Date of Onset of Symptoms: 11/14/2013  Guideline(s) Used:  Heart Rate and Heartbeat Questions  Disposition Per Guideline:   Go to ED Now (or to Office with PCP Approval)  Reason For Disposition Reached:   New or worsened shortness of breath with activity (dyspnea on exertion)  Advice Given:  N/A  Appointment Scheduled:  11/28/2013 14:15:00 Appointment Scheduled Provider:  Loura Pardon [facility])

## 2013-11-28 NOTE — Telephone Encounter (Signed)
Sounds appropriate for OV initially

## 2013-11-28 NOTE — Patient Instructions (Signed)
Avoid alcohol and also caffeine for now  Stop up front for a referral to cardiology Take one 81 mg coated aspirin daily with food  If symptoms suddenly worsen-go to the emergency room

## 2013-11-28 NOTE — Progress Notes (Signed)
Subjective:    Patient ID: Elijah Logan, male    DOB: 1981-01-27, 33 y.o.   MRN: [id number]  HPI Here for chest discomfort   Started 2 weeks ago - when he got up from playing with son on the floor- stood up and it felt like his heart was fluttering  As the week went on- he experienced more palpitations - on exertion primarily Wife took his pulse - had an irregularly irregular rhythm  Although played golf and was fine   Some stressors - perhaps anx, new job, also has a baby at home  Some burping   Has noticed in addition to that - when he climbs stairs he gets more sob than usual and some chest tightness (R of midline) without radiation  Sweats when he is hot  Had some ankle swelling after sitting for a long time in a conference   No nausea   A lot of heart problems in the family  Father has 3 stents - started in early to mid 48s and uncle in 55s as well   Has sleep apnea and does wear his cpap every night  Not a lot of sleep with an infant   Lab Results  Component Value Date   CHOL 184 10/22/2013   HDL 34.90* 10/22/2013   LDLCALC 119* 10/22/2013   TRIG 149.0 10/22/2013   CHOLHDL 5 10/22/2013   used to be on simvastatin - and PCP stopped it     Review of Systems Review of Systems  Constitutional: Negative for fever, appetite change,  and unexpected weight change.  Eyes: Negative for pain and visual disturbance.  Respiratory: Negative for cough and shortness of breath.   Cardiovascular: Negative for pnd/orthopnea/ leg pain/ pedal edema     Gastrointestinal: Negative for nausea, diarrhea and constipation.  Genitourinary: Negative for urgency and frequency.  Skin: Negative for pallor or rash   Neurological: Negative for weakness, light-headedness, numbness and headaches.  Hematological: Negative for adenopathy. Does not bruise/bleed easily.  Psychiatric/Behavioral: Negative for dysphoric mood. The patient is not nervous/anxious.         Objective:   Physical Exam    Constitutional: He appears well-developed and well-nourished. No distress.  Morbidly obese and well appearing   HENT:  Head: Normocephalic and atraumatic.  Mouth/Throat: Oropharynx is clear and moist.  Eyes: Conjunctivae and EOM are normal. Pupils are equal, round, and reactive to light. No scleral icterus.  Neck: Normal range of motion. Neck supple. No JVD present. Carotid bruit is not present. No thyromegaly present.  Cardiovascular: Normal rate, regular rhythm and intact distal pulses.  Exam reveals no gallop.   Pulmonary/Chest: Effort normal and breath sounds normal. No respiratory distress. He has no wheezes. He has no rales. He exhibits no tenderness.  Abdominal: Soft. Bowel sounds are normal. He exhibits no distension and no mass. There is no tenderness.  Musculoskeletal: He exhibits no edema.  Lymphadenopathy:    He has no cervical adenopathy.  Neurological: He is alert. He has normal reflexes. No cranial nerve deficit. He exhibits normal muscle tone. Coordination normal.  Skin: Skin is warm and dry. No rash noted. He is not diaphoretic. No erythema. No pallor.  Psychiatric: He has a normal mood and affect.  Pt seems generally stressed  Pleasant and attentive           Assessment & Plan:   Problem List Items Addressed This Visit     Other   Chest discomfort - Primary  Exertional in pt with risk factors  Reassuring EKG today  Ref to cardiol    Relevant Orders      Ambulatory referral to Cardiology   Palpitations     Per wife- at one time had an irregularly irreg HR- (not today)- reassuring ekg  Ref to cardiol Asked to hold caffeine and alcohol  Does have new stressors       Relevant Orders      Ambulatory referral to Cardiology   Exercise intolerance     With stairs primarily Pt is obese-trying to loose wt Chest discomfort on exertion as well Ref to cardiol       Relevant Orders      Ambulatory referral to Cardiology    Other Visit Diagnoses    Chest tightness        Relevant Orders       EKG 12-Lead (Completed)

## 2013-11-28 NOTE — Progress Notes (Signed)
Pre visit review using our clinic review tool, if applicable. No additional management support is needed unless otherwise documented below in the visit note. 

## 2013-11-28 NOTE — Assessment & Plan Note (Signed)
Per wife- at one time had an irregularly irreg HR- (not today)- reassuring ekg  Ref to cardiol Asked to hold caffeine and alcohol  Does have new stressors

## 2013-11-30 ENCOUNTER — Encounter: Payer: Self-pay | Admitting: Cardiology

## 2013-12-05 ENCOUNTER — Encounter: Payer: Self-pay | Admitting: Cardiology

## 2013-12-05 ENCOUNTER — Ambulatory Visit (INDEPENDENT_AMBULATORY_CARE_PROVIDER_SITE_OTHER): Payer: BC Managed Care – PPO | Admitting: Cardiology

## 2013-12-05 VITALS — BP 160/89 | HR 72 | Ht 70.0 in | Wt 335.8 lb

## 2013-12-05 DIAGNOSIS — R079 Chest pain, unspecified: Secondary | ICD-10-CM

## 2013-12-05 DIAGNOSIS — R0989 Other specified symptoms and signs involving the circulatory and respiratory systems: Secondary | ICD-10-CM

## 2013-12-05 DIAGNOSIS — I4949 Other premature depolarization: Secondary | ICD-10-CM

## 2013-12-05 DIAGNOSIS — I493 Ventricular premature depolarization: Secondary | ICD-10-CM

## 2013-12-05 DIAGNOSIS — E785 Hyperlipidemia, unspecified: Secondary | ICD-10-CM

## 2013-12-05 DIAGNOSIS — R0609 Other forms of dyspnea: Secondary | ICD-10-CM

## 2013-12-05 DIAGNOSIS — R002 Palpitations: Secondary | ICD-10-CM

## 2013-12-05 MED ORDER — NITROGLYCERIN 0.4 MG SL SUBL: 0.4000 mg | SUBLINGUAL_TABLET | SUBLINGUAL | Status: DC | PRN

## 2013-12-05 NOTE — Patient Instructions (Addendum)
Your physician has requested that you have an echocardiogram. Echocardiography is a painless test that uses sound waves to create images of your heart. It provides your doctor with information about the size and shape of your heart and how well your heart's chambers and valves are working. This procedure takes approximately one hour. There are no restrictions for this procedure.   Your physician has requested that you have a stress echocardiogram. For further information please visit [email or web address]. Please follow instruction sheet as given. Wear comfortable running shoes and gym cloths  You can eat a light meal before   Your physician has recommended that you wear an event monitor for 2 weeks. Event monitors are medical devices that record the heart's electrical activity. Doctors most often [place] these monitors to diagnose arrhythmias. Arrhythmias are problems with the speed or rhythm of the heartbeat. The monitor is a small, portable device. You can wear one while you do your normal daily activities. This is usually used to diagnose what is causing palpitations/syncope (passing out).   Your physician recommends that you schedule a follow-up appointment on: 01/02/14  with Dr. Ellyn Hack   Your physician has recommended you make the following change in your medication:   Start Nitroglycerin 0.4 mg one tablet every five minutes x 3 as needed for chest pain

## 2013-12-06 ENCOUNTER — Encounter: Payer: Self-pay | Admitting: Cardiology

## 2013-12-06 NOTE — Progress Notes (Signed)
PATIENT: Aziz Slape MRN: [id number] DOB: June 20, 1980 PCP: Viviana Simpler, MD  Clinic Note: Chief Complaint  Patient presents with  . Other    C/o irregular heart beat,indigestion, sob and edema ankles. Meds reviewed verbally with pt.    HPI: Abdulahad Mederos is a 33 y.o. male with a PMH below who presents today for evaluation of chest tightness, shortness of breath and palpitations.  He is very pleasant young man who is the son of the patient of this practice with history of CAD initially in his early 47s status post PCI. 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  He does note having some increased stress with his job as well as the new baby at home.  Interval History: He is actually in new second time father of 66-month-old child who was "roughhousing" with his 71-year-old son 3 weeks ago. When he stood up from the floor he felt like his heart was fluttering out of control and going very fast. This is associated with a sensation of chest tightness and shortness of breath. He felt as though his heart was pounding out of his chest. Ever since this episode he is still had intermittent short goals of heart palpitations and pounding usually made worse with exertion. Spelled are usually associated with dyspnea and chest tightness, however he has had some chest tightness and dyspnea without palpitations. He describes the tightness as a heavy pressure in his chest as occasionally sharp. This is occasionally associated with burping sensation to He feels as though it's difficult to get a breath. He notices it mostly been worse for instance when walking up a flight of steps - with chest tightness at this time usually been in the mid chest without real radiation. He gets profusely sweaty when it's hot.. He has not had any syncope or near syncopal type symptoms that would be his palpitations but has felt dizzy. Noted occasional lower  extremity swelling after long sitting, but no PND or orthopnea. His wife notes that when she felt his pulse during these episodes it was described as irregularly irregular and fast.  No TIA or amaurosis fugax symptoms. No claudication.  Past Medical History  Diagnosis Date  . Hyperlipidemia   . Moderate obesity   . Chronic low back pain     Has spina bifida occulta  . Allergic rhinitis due to pollen     mild  . OSA on CPAP   . History of kidney stones     Prior Cardiac Evaluation and Past Surgical History: Past Surgical History  Procedure Laterality Date  . Open anterior shoulder reconstruction  1998    Right    No Known Allergies  Current Outpatient Prescriptions  Medication Sig Dispense Refill  . aspirin 81 MG tablet Take 81 mg by mouth daily.      Marland Kitchen FIBER FORMULA CAPS Take 2 capsules by mouth daily.      . Omega-3 Fatty Acids (FISH OIL) 1200 MG CAPS Take 3 capsules by mouth daily.      . nitroGLYCERIN (NITROSTAT) 0.4 MG SL tablet Place 1 tablet (0.4 mg total) under the tongue every 5 (five) minutes as needed for chest pain.  25 tablet  3   No current facility-administered medications for this visit.    History   Social History Narrative   He is a married father of 2 - ages 44 years and 5 months.   He  currently works for [employer] in [place]. His wife is a Designer, jewellery who is with him today.   He has never smoked, and does not drink alcohol.   Family History: Diabetes, Heart attack/CAD in his father; Diabetes in his  maternal grandmother, mother, and paternal grandmother; there is also history of stroke and hypertension as well as cancer.  ROS: A comprehensive Review of Systems was performed: Review of Systems  Constitutional: Negative for fever and chills.  Respiratory: Positive for shortness of breath. Negative for cough, hemoptysis and wheezing.   Cardiovascular: Positive for chest pain, palpitations and leg swelling. Negative for orthopnea,  claudication and PND.  Gastrointestinal: Positive for heartburn. Negative for nausea, vomiting, blood in stool and melena.  Genitourinary: Negative for frequency, hematuria and flank pain.  Neurological: Negative for tingling, tremors, sensory change, speech change, focal weakness, seizures and loss of consciousness.  Endo/Heme/Allergies: Does not bruise/bleed easily.  All other systems reviewed and are negative.  PHYSICAL EXAM BP 160/89  Pulse 72  Ht 5\' 10"  (1.778 m)  Wt 335 lb 12 oz (152.295 kg)  BMI 48.18 kg/m2 General appearance: alert, cooperative, appears stated age, no distress, moderately obese and Well-groomed, pleasant mood and affect but anxious Neck: no adenopathy, no carotid bruit, supple, symmetrical, trachea midline and Unable to assess JVP due to body habitus Lungs: clear to auscultation bilaterally, normal percussion bilaterally and Nonlabored, good air movement Heart: Distant heart sounds, but mostly RRR with ectopy, no obvious M./R./G. with distant sounds. Unable to palpate PMI Abdomen: soft, non-tender; bowel sounds normal; no masses,  no organomegaly and Morbid truncal obesity Extremities: extremities normal, atraumatic, no cyanosis or edema and no ulcers, gangrene or trophic changes Pulses: 2+ and symmetric Neurologic: Alert and oriented X 3, normal strength and tone. Normal symmetric reflexes. Normal coordination and gait Mental status: Alert, oriented, thought content appropriate Cranial nerves: normal   Adult ECG Report  Rate: 72 ;  Rhythm: normal sinus rhythm and With frequent PVCs and a ventricular trigeminy pattern colon otherwise normal axis, intervals.  Recent Labs: 10/22/2013:  Normal CBC and CMP  TC 194, TG 149, HDL 34.9, LDL 119  ASSESSMENT / PLAN: Very pleasant had near morbidly obese gentleman presenting with rapid palpitations and chest discomfort as well as exertional dyspnea.  Chest pain with moderate risk for cardiac etiology I am quite  concerned about the nature of his symptoms. This could simply be related to whatever rapid palpitations he is feeling. However in the setting of frequent PVCs and borderline characteristics for metabolic syndrome (hypertension today, truncal obesity and low HDL) bili some moderate risk of this discomfort being from an ischemic coronary etiology.  Plan: Treadmill Echocardiogram  Exertional dyspnea This could very well be related to obesity and deconditioning, however it is a new symptom since the onset of these rapid heart beat episodes. Again concerning for possible ischemic etiology.  Plan: Treadmill Echocardiogram Stress Test  PVC's (premature ventricular contractions) Frequent PVCs in a trigeminy pattern.  His heart rate is not rapid, although he does notice some of these ectopic beats. He says that he feels his heart beat pounding rapidly during his episodes.  In addition to treadmill stress echocardiogram, we will also do a standard 2-D echocardiogram to assess for structural abdomen obese mostly because his clinical exam is very unhelpful due to body habitus.  Palpitations Reportedly irregularly irregular by the patient's wife. He is not drinking caffeine her PCP recommendations. He does have a new stress in his life, but  recurrent episodes were triggered after exertion.  I will have him wear a cardiac event monitor for 2 weeks in addition to checking a 2-D echocardiogram and Treadmill Echocardiogram Stress  Hyperlipidemia His levels look relatively good. However if he does prove to have CAD, his goal HDL will still be greater than 40 but his LDL goal be less than 70. Currently with the exception of HDL, he is pretty much at goal  Severe obesity (BMI >= 40) Discussed healthy dietary options. However in order for do initiate a fitness regimen, he continued to have his chest discomfort evaluated first.   Orders Placed This Encounter  Procedures  . EKG 12-Lead    Order Specific  Question:  Where should this test be performed    Answer:  [facility]  . Cardiac event monitor    Standing Status: Future     Number of Occurrences:      Standing Expiration Date: 12/05/2014  . 2D Echocardiogram without contrast    Standing Status: Future     Number of Occurrences:      Standing Expiration Date: 12/05/2014    Order Specific Question:  Type of Echo    Answer:  Complete    Order Specific Question:  Where should this test be performed    Answer:  [facility]    Order Specific Question:  Reason for exam-Echo    Answer:  Chest Pain  786.50  . Echocardiogram stress test without contrast    Standing Status: Future     Number of Occurrences:      Standing Expiration Date: 12/05/2014    Order Specific Question:  Type of Echo    Answer:  Complete    Order Specific Question:  Where should this test be performed    Answer:  [facility]    Order Specific Question:  Reason for exam-Echo    Answer:  Chest Pain  786.50   Meds ordered this encounter  Medications  . aspirin 81 MG tablet    Sig: Take 81 mg by mouth daily.  . nitroGLYCERIN (NITROSTAT) 0.4 MG SL tablet    Sig: Place 1 tablet (0.4 mg total) under the tongue every 5 (five) minutes as needed for chest pain.    Dispense:  25 tablet    Refill:  3    Followup: ~1 months  Woodroe Vogan W. Ellyn Hack, M.D., M.S. Interventional Cardiolgy [facility]

## 2013-12-07 DIAGNOSIS — R079 Chest pain, unspecified: Secondary | ICD-10-CM | POA: Insufficient documentation

## 2013-12-07 DIAGNOSIS — I493 Ventricular premature depolarization: Secondary | ICD-10-CM | POA: Insufficient documentation

## 2013-12-07 DIAGNOSIS — R0609 Other forms of dyspnea: Secondary | ICD-10-CM | POA: Insufficient documentation

## 2013-12-07 NOTE — Assessment & Plan Note (Signed)
His levels look relatively good. However if he does prove to have CAD, his goal HDL will still be greater than 40 but his LDL goal be less than 70. Currently with the exception of HDL, he is pretty much at goal

## 2013-12-07 NOTE — Assessment & Plan Note (Signed)
Discussed healthy dietary options. However in order for do initiate a fitness regimen, he continued to have his chest discomfort evaluated first.

## 2013-12-07 NOTE — Assessment & Plan Note (Signed)
Reportedly irregularly irregular by the patient's wife. He is not drinking caffeine her PCP recommendations. He does have a new stress in his life, but recurrent episodes were triggered after exertion.  I will have him wear a cardiac event monitor for 2 weeks in addition to checking a 2-D echocardiogram and Treadmill Echocardiogram Stress

## 2013-12-07 NOTE — Assessment & Plan Note (Signed)
Frequent PVCs in a trigeminy pattern.  His heart rate is not rapid, although he does notice some of these ectopic beats. He says that he feels his heart beat pounding rapidly during his episodes.  In addition to treadmill stress echocardiogram, we will also do a standard 2-D echocardiogram to assess for structural abdomen obese mostly because his clinical exam is very unhelpful due to body habitus.

## 2013-12-07 NOTE — Assessment & Plan Note (Signed)
This could very well be related to obesity and deconditioning, however it is a new symptom since the onset of these rapid heart beat episodes. Again concerning for possible ischemic etiology.  Plan: Treadmill Echocardiogram Stress Test

## 2013-12-07 NOTE — Assessment & Plan Note (Addendum)
I am quite concerned about the nature of his symptoms. This could simply be related to whatever rapid palpitations he is feeling. However in the setting of frequent PVCs and borderline characteristics for metabolic syndrome (hypertension today, truncal obesity and low HDL) bili some moderate risk of this discomfort being from an ischemic coronary etiology.  Plan: Treadmill Echocardiogram Stress Test; start daily aspirin 81 mg; sublingual NTG PRN

## 2013-12-11 DIAGNOSIS — I498 Other specified cardiac arrhythmias: Secondary | ICD-10-CM

## 2013-12-27 ENCOUNTER — Telehealth: Payer: Self-pay

## 2013-12-27 NOTE — Telephone Encounter (Signed)
lvm 8/27

## 2013-12-27 NOTE — Telephone Encounter (Signed)
Pt states his heart monitor has stopped working, states he has contacted the company, and they have been playing phone tag, and has not talked with anyone. Also, he wants to know if he should move his appt with Dr harding to after his stress echo, which is on  9/17.please call and advise

## 2013-12-28 ENCOUNTER — Other Ambulatory Visit (INDEPENDENT_AMBULATORY_CARE_PROVIDER_SITE_OTHER): Payer: BC Managed Care – PPO

## 2013-12-28 ENCOUNTER — Other Ambulatory Visit: Payer: BC Managed Care – PPO

## 2013-12-28 ENCOUNTER — Other Ambulatory Visit: Payer: Self-pay

## 2013-12-28 DIAGNOSIS — I493 Ventricular premature depolarization: Secondary | ICD-10-CM

## 2013-12-28 DIAGNOSIS — R079 Chest pain, unspecified: Secondary | ICD-10-CM

## 2013-12-28 DIAGNOSIS — R0602 Shortness of breath: Secondary | ICD-10-CM

## 2013-12-28 HISTORY — PX: TRANSTHORACIC ECHOCARDIOGRAM: SHX275

## 2013-12-28 NOTE — Telephone Encounter (Signed)
Discussed with patient in the office  Patient is contacting e cardio

## 2014-01-01 ENCOUNTER — Other Ambulatory Visit (INDEPENDENT_AMBULATORY_CARE_PROVIDER_SITE_OTHER): Payer: BC Managed Care – PPO

## 2014-01-01 DIAGNOSIS — I493 Ventricular premature depolarization: Secondary | ICD-10-CM

## 2014-01-01 DIAGNOSIS — R079 Chest pain, unspecified: Secondary | ICD-10-CM

## 2014-01-01 DIAGNOSIS — R0602 Shortness of breath: Secondary | ICD-10-CM

## 2014-01-01 NOTE — Progress Notes (Signed)
Quick Note:  Echo results: Good news: Essentially normal echocardiogram and normal pump function and normal valve function. No signs to suggest heart attack.. EF: 55-60%. No regional wall motion abnormalities  Leonie Man, M.D., M.S. Interventional Cardiologist   Pager # [phone] 01/01/2014    ______

## 2014-01-02 ENCOUNTER — Ambulatory Visit (INDEPENDENT_AMBULATORY_CARE_PROVIDER_SITE_OTHER): Payer: BC Managed Care – PPO | Admitting: Cardiology

## 2014-01-02 ENCOUNTER — Encounter: Payer: Self-pay | Admitting: Cardiology

## 2014-01-02 VITALS — BP 150/90 | HR 72 | Ht 70.0 in | Wt 332.5 lb

## 2014-01-02 DIAGNOSIS — I4949 Other premature depolarization: Secondary | ICD-10-CM

## 2014-01-02 DIAGNOSIS — G4733 Obstructive sleep apnea (adult) (pediatric): Secondary | ICD-10-CM

## 2014-01-02 DIAGNOSIS — R079 Chest pain, unspecified: Secondary | ICD-10-CM

## 2014-01-02 DIAGNOSIS — E785 Hyperlipidemia, unspecified: Secondary | ICD-10-CM

## 2014-01-02 DIAGNOSIS — R002 Palpitations: Secondary | ICD-10-CM

## 2014-01-02 DIAGNOSIS — I493 Ventricular premature depolarization: Secondary | ICD-10-CM

## 2014-01-02 DIAGNOSIS — R0602 Shortness of breath: Secondary | ICD-10-CM

## 2014-01-02 HISTORY — PX: OTHER SURGICAL HISTORY: SHX169

## 2014-01-02 NOTE — Patient Instructions (Signed)
Your physician wants you to follow-up in: 6 months. You will receive a reminder letter in the mail two months in advance. If you don't receive a letter, please call our office to schedule the follow-up appointment.   Continue to wear holter monitor

## 2014-01-04 ENCOUNTER — Encounter: Payer: Self-pay | Admitting: Cardiology

## 2014-01-04 NOTE — Assessment & Plan Note (Signed)
Thankfully his stress test was read today. I read the EKG portion of the right. The echocardiographic images were reviewed by Dr. Fletcher Anon and they also seemed normal. This is excellent for him. I spent several minutes reassuring him of the results being negative for any ischemia or infarction. This would make his symptoms less likely to be cardiac in nature.   With these results, now he can try to get back into an exercise routine.

## 2014-01-04 NOTE — Assessment & Plan Note (Signed)
I stressed the importance of continuing to use his CPAP. This will help avoid development of severe pulmonary hypertension and obesity hyperventilation syndrome. Will also help his overall blood pressure control.

## 2014-01-04 NOTE — Assessment & Plan Note (Signed)
Further PVCs occasionally in trigeminy and bigeminy pattern may very well be symptomatic, but with normal echo and treadmill echo are likely benign.

## 2014-01-04 NOTE — Assessment & Plan Note (Signed)
Levels looked relatively good originally. With the negative evaluation to date his goal can be LDL less than 130 with an intent to get down to close to 100.  HDL also be important to monitor. Hopefully with exercise this will increase as well

## 2014-01-04 NOTE — Progress Notes (Signed)
PCP: Viviana Simpler, MD  Clinic Note: Chief Complaint  Patient presents with  . OTHER    F/u from echo/ecardio c/o chest pain and sob. Meds reviewed verbally with pt.    HPI: Elijah Logan is a 33 y.o. male with a Cardiovascular Problem List below who presents today for followup of his various studies were done to evaluate chest tightness, shortness of breath or palpitation. As you recall he is a morbidly obese young man on CPAP for OSA. 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 He was referred for evaluation of a sensation of his heart was fluttering out of control after wrestling with his 53 year-old son. Since then he is had multiple episodes of his heart pounding and pulsating. He is chest tightness as well as dyspnea without palpitations. Simply due to his obesity and hyperlipidemia and significant family history of CAD, he was evaluated with an echocardiogram (that was possibly in conjunction with a treadmill exercise echocardiogram) as well as and that monitor. There were some issues with the event monitor, and he was unable to get full study. The existing results demonstrated only sinus rhythm with PVCs occasionally in couplets and bigeminy. Both the band or an exercise echocardiography results were very encouraging.  Interval History: Since his visit in August, he has dramatically cut back on about caffeine he intakes, and has worked on trying to relieve his stress. He has noted significantly less palpitations and chest discomfort in the last 2 weeks. He still has intermittent palpitations but did not as dramatic. Similarly, the episodes of chest tightness have also decreased. He's not had any significant exertional dyspnea, but has not really been doing that much exercise until he heard the results of this exercise echocardiogram. He denies any PND, orthopnea or edema. No syncopal or near syncopal episodes. No TIA or  amaurosis fugax symptoms.  Past Medical History  Diagnosis Date  . Hyperlipidemia   . Moderate obesity   . Chronic low back pain     Has spina bifida occulta  . Allergic rhinitis due to pollen     mild  . OSA on CPAP   . History of kidney stones   . Heart palpitations     PVCs noted on Event Monitor - some in Bigeminy or couplets.   Prior Cardiac Evaluation and Past Surgical History: Past Surgical History  Procedure Laterality Date  . Open anterior shoulder reconstruction  1998    Right  . Transthoracic echocardiogram  12/28/2013    EF 55-60%.  Normal diastolic function  . Treadmill stress echo  01/02/2014    Ex 10:31 Min; 12.5 METS --> no EKG changes, Normal pre & post Echo Wall Motion. -- No ischemia or infarction   MEDICATIONS AND ALLERGIES REVIEWED IN EPIC No Change in Social and Family History  ROS: A comprehensive Review of Systems -  Review of Systems  Constitutional: Negative for fever and chills.  Cardiovascular: Positive for palpitations.  Gastrointestinal: Negative for heartburn, nausea, vomiting, constipation, blood in stool and melena.  Genitourinary: Positive for frequency. Negative for hematuria and flank pain.  Neurological: Negative for headaches.  Endo/Heme/Allergies: Negative for polydipsia.  Psychiatric/Behavioral:       Improving levels of anxiety  All other systems reviewed and are negative.  Wt Readings from Last 3 Encounters:  01/02/14 332 lb 8 oz (150.821 kg)  12/05/13 335 lb 12 oz (152.295 kg)  11/28/13 340 lb 8  oz (154.45 kg)    PHYSICAL EXAM BP 150/90  Pulse 72  Ht 5\' 10"  (1.778 m)  Wt 332 lb 8 oz (150.821 kg)  BMI 47.71 kg/m2 General appearance: alert, cooperative, appears stated age, no distress, moderately obese and Well-groomed, pleasant mood and affect but anxious  Neck: no adenopathy, no carotid bruit, supple, symmetrical, trachea midline and Unable to assess JVP due to body habitus  Lungs: clear to auscultation bilaterally, normal  percussion bilaterally and Nonlabored, good air movement  Heart: Distant heart sounds, but mostly RRR with ectopy, no obvious M./R./G. with distant sounds. Unable to palpate PMI  Abdomen: soft, non-tender; bowel sounds normal; no masses, no organomegaly and Morbid truncal obesity  Extremities: extremities normal, atraumatic, no cyanosis or edema and no ulcers, gangrene or trophic changes  Pulses: 2+ and symmetric  Neurologic: Alert and oriented X 3, normal strength and tone. Normal symmetric reflexes. Normal coordination and gait  Mental status: Alert, oriented, thought content appropriate  Cranial nerves: normal   Adult ECG Report  Rate: 72 ;  Rhythm: normal sinus rhythm - Incomplete RBBB.  Otherwise normal.  Recent Labs:  None   ASSESSMENT / PLAN: Chest pain with minimal risk for cardiac etiology Thankfully his stress test was read today. I read the EKG portion of the right. The echocardiographic images were reviewed by Dr. Fletcher Anon and they also seemed normal. This is excellent for him. I spent several minutes reassuring him of the results being negative for any ischemia or infarction. This would make his symptoms less likely to be cardiac in nature.   With these results, now he can try to get back into an exercise routine.  Palpitations Normal echocardiogram and so far normal results on the event monitor. He still has about a week left to where it because of some issues. He would like to continue just to make sure. Thankfully the episodes of gotten better. Has the potential of using a beta blocker to treat, but we both agreed that expectant management would be the best option to avoid potential side effects of beta blocker.  Severe obesity (BMI >= 40) Again we discussed healthy dietary options but reassured him that he should be fine getting into an exercise program. Goal weight would be to lose 10% body weight annually.  PVC's (premature ventricular contractions) Further PVCs  occasionally in trigeminy and bigeminy pattern may very well be symptomatic, but with normal echo and treadmill echo are likely benign.  Hyperlipidemia with target LDL less than 130 Levels looked relatively good originally. With the negative evaluation to date his goal can be LDL less than 130 with an intent to get down to close to 100.  HDL also be important to monitor. Hopefully with exercise this will increase as well  OSA (obstructive sleep apnea) I stressed the importance of continuing to use his CPAP. This will help avoid development of severe pulmonary hypertension and obesity hyperventilation syndrome. Will also help his overall blood pressure control.    Orders Placed This Encounter  Procedures  . EKG 12-Lead    Order Specific Question:  Where should this test be performed    Answer:  [facility]   No orders of the defined types were placed in this encounter.    Followup: 6 months  DAVID W. Ellyn Hack, M.D., M.S. Interventional Cardiologist [facility]

## 2014-01-04 NOTE — Assessment & Plan Note (Signed)
Normal echocardiogram and so far normal results on the event monitor. He still has about a week left to where it because of some issues. He would like to continue just to make sure. Thankfully the episodes of gotten better. Has the potential of using a beta blocker to treat, but we both agreed that expectant management would be the best option to avoid potential side effects of beta blocker.

## 2014-01-04 NOTE — Assessment & Plan Note (Signed)
Again we discussed healthy dietary options but reassured him that he should be fine getting into an exercise program. Goal weight would be to lose 10% body weight annually.

## 2014-01-08 ENCOUNTER — Ambulatory Visit (INDEPENDENT_AMBULATORY_CARE_PROVIDER_SITE_OTHER): Payer: BC Managed Care – PPO | Admitting: Pulmonary Disease

## 2014-01-08 ENCOUNTER — Encounter: Payer: Self-pay | Admitting: Pulmonary Disease

## 2014-01-08 VITALS — BP 120/80 | HR 75 | Temp 98.6°F | Ht 70.5 in | Wt 331.4 lb

## 2014-01-08 DIAGNOSIS — G4733 Obstructive sleep apnea (adult) (pediatric): Secondary | ICD-10-CM

## 2014-01-08 NOTE — Patient Instructions (Signed)
Work with your home care company on a better fitting mask.  If unsuccessful, let me know and we can arrange for fitting session at the sleep center. Keep working on weight loss. followup with me again in one year

## 2014-01-08 NOTE — Assessment & Plan Note (Signed)
The patient is doing very well from a sleep apnea standpoint on his current CPAP setting. His download shows excellent compliance, and good control of his AHI. He is having some mask fit issues, and I have referred him back to his home care company to work on this. If he continues to have issues, will need to refer to the sleep Center for a mask fitting session. I've also encouraged him to work aggressively on weight loss.

## 2014-01-08 NOTE — Progress Notes (Signed)
   Subjective:    Patient ID: Elijah Logan, male    DOB: March 04, 1981, 33 y.o.   MRN: [id number]  HPI The patient comes in today for followup of his obstructive sleep apnea. He is wearing CPAP compliantly by his download, with excellent control of his AHI. He is having some mask leak issues, and is not satisfied with his fit even when the mask is new. He does feel that he sleeps well with the device and has adequate daytime alertness. Of note, his weight is increased a few pounds since last visit.   Review of Systems  Constitutional: Negative for fever and unexpected weight change.  HENT: Negative for congestion, dental problem, ear pain, nosebleeds, postnasal drip, rhinorrhea, sinus pressure, sneezing, sore throat and trouble swallowing.   Eyes: Negative for redness and itching.  Respiratory: Negative for cough, chest tightness, shortness of breath and wheezing.   Cardiovascular: Negative for palpitations and leg swelling.  Gastrointestinal: Negative for nausea and vomiting.  Genitourinary: Negative for dysuria.  Musculoskeletal: Negative for joint swelling.  Skin: Negative for rash.  Neurological: Negative for headaches.  Hematological: Does not bruise/bleed easily.  Psychiatric/Behavioral: Negative for dysphoric mood. The patient is not nervous/anxious.        Objective:   Physical Exam Obese male in no acute distress Nose without purulence or discharge noted Neck without lymphadenopathy or thyromegaly No skin breakdown or pressure necrosis from the CPAP mask Lower extremities without edema, no cyanosis Alert and oriented, moves all 4 extremities.       Assessment & Plan:

## 2014-01-17 ENCOUNTER — Other Ambulatory Visit: Payer: BC Managed Care – PPO

## 2014-03-04 ENCOUNTER — Telehealth: Payer: Self-pay | Admitting: *Deleted

## 2014-03-04 NOTE — Telephone Encounter (Signed)
Called patient to inform him that per Dr. Ellyn Hack cardiac event monitor shows  NSR with PVC's One run of 3 pvc's   LVM 03/04/14

## 2014-03-05 ENCOUNTER — Other Ambulatory Visit: Payer: Self-pay

## 2014-03-05 ENCOUNTER — Encounter (INDEPENDENT_AMBULATORY_CARE_PROVIDER_SITE_OTHER): Payer: BC Managed Care – PPO

## 2014-03-05 DIAGNOSIS — I493 Ventricular premature depolarization: Secondary | ICD-10-CM

## 2014-03-05 DIAGNOSIS — R079 Chest pain, unspecified: Secondary | ICD-10-CM

## 2014-06-26 ENCOUNTER — Ambulatory Visit: Payer: BC Managed Care – PPO | Admitting: Cardiology

## 2014-07-17 ENCOUNTER — Ambulatory Visit (INDEPENDENT_AMBULATORY_CARE_PROVIDER_SITE_OTHER): Payer: BLUE CROSS/BLUE SHIELD | Admitting: Cardiology

## 2014-07-17 ENCOUNTER — Encounter: Payer: Self-pay | Admitting: Cardiology

## 2014-07-17 VITALS — BP 110/78 | HR 83 | Ht 70.0 in | Wt 316.8 lb

## 2014-07-17 DIAGNOSIS — R6889 Other general symptoms and signs: Secondary | ICD-10-CM

## 2014-07-17 DIAGNOSIS — I493 Ventricular premature depolarization: Secondary | ICD-10-CM

## 2014-07-17 DIAGNOSIS — R0609 Other forms of dyspnea: Secondary | ICD-10-CM

## 2014-07-17 NOTE — Patient Instructions (Signed)
Please keep working on your weight loss [employer] job!  Your physician wants you to follow-up in: 1 year  You will receive a reminder letter in the mail two months in advance. If you don't receive a letter, please call our office to schedule the follow-up appointment.

## 2014-07-18 ENCOUNTER — Encounter: Payer: Self-pay | Admitting: Cardiology

## 2014-07-18 NOTE — Assessment & Plan Note (Signed)
Much less prominent now on. He thinks a lot of it had to do with his stress with her job situation. He thinks it partly the reassurance from his initial evaluation is made in that much attention to what he does feel some palpitations. Normal echo and normal treadmill echo, likely benign.

## 2014-07-18 NOTE — Assessment & Plan Note (Signed)
Continues to improve. Now able to tolerate increased levels of exercise.

## 2014-07-18 NOTE — Assessment & Plan Note (Signed)
Much improved with weight loss. Not likely to be cardiac in nature based on normal TM Stress Echo & 2D Echo

## 2014-07-18 NOTE — Progress Notes (Signed)
PCP: Viviana Simpler, MD  Clinic Note: Chief Complaint  Patient presents with  . other    6 month f/u no complaints. Meds reviewed verbally with pt.  . Palpitations  . Shortness of Breath   HPI: Elijah Logan is a 34 y.o. male with a PMH below who presents today for six-month followup of significant heart palpitations and dyspnea along with chest tightness.. As you recall he is a morbidly obese young man on CPAP for OSA. 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 He was referred for evaluation of a sensation of his heart was fluttering out of control after wrestling with his 64 year-old son. Since then he is had multiple episodes of his heart pounding and pulsating. He is chest tightness as well as dyspnea without palpitations. He was evaluated with an event monitor that showed only minimal ectopy with PVCs occasionally couplets and bigeminy but no true arrhythmias. His baseline echocardiogram and stress echocardiogram were unremarkable.  Past Medical History  Diagnosis Date  . Hyperlipidemia   . Moderate obesity   . Chronic low back pain     Has spina bifida occulta  . Allergic rhinitis due to pollen     mild  . OSA on CPAP   . History of kidney stones   . Heart palpitations     PVCs noted on Event Monitor - some in Bigeminy or couplets.  . Bronchitis   . Bulging lumbar disc     Prior Cardiac Evaluation and Past Surgical History: Past Surgical History  Procedure Laterality Date  . Open anterior shoulder reconstruction  1998    Right  . Transthoracic echocardiogram  12/28/2013    EF 55-60%.  Normal diastolic function  . Treadmill stress echo  01/02/2014    Ex 10:31 Min; 12.5 METS --> no EKG changes, Normal pre & post Echo Wall Motion. -- No ischemia or infarction   Interval History: Elijah Logan presents today really with no complaints at all. He is in good spirits. He has been exercising and watching his diet. He has lost  15 pounds since his last visit. He is very excited about that. He notes significant increase in his energy level. He denies any further episodes of chest tightness or significant exertional dyspnea unless he really pushes himself with exercise.  He really has not noted any significant palpitations. We had opted to avoid medical management, in lieu of lifestyle modification, and it seems to be working. He is not having any more of his presenting symptoms.. No PND, orthopnea or edema.  Minimal palpitations, without any symptoms of lightheadedness, dizziness, weakness or syncope/near syncope. No TIA/amaurosis fugax symptoms. No melena, hematochezia, hematuria, or epstaxis. No claudication.  ROS: A comprehensive was performed. Review of Systems  Constitutional: Positive for weight loss (Intentional).  Musculoskeletal: Positive for myalgias (He does get a bit sore with his exercises.).  Neurological: Negative for dizziness and headaches.  Psychiatric/Behavioral: Negative for depression.  All other systems reviewed and are negative.   Current Outpatient Prescriptions on File Prior to Visit  Medication Sig Dispense Refill  . nitroGLYCERIN (NITROSTAT) 0.4 MG SL tablet Place 1 tablet (0.4 mg total) under the tongue every 5 (five) minutes as needed for chest pain. 25 tablet 3   No current facility-administered medications on file prior to visit.   No Known Allergies  History  Substance Use Topics  . Smoking status: Never Smoker   . Smokeless tobacco:  Never Used  . Alcohol Use: Yes     Comment: 0-1week   Family History  Problem Relation Age of Onset  . Diabetes Mother     borderline  . Diabetes Father     borderline  . Heart disease Father     MI and needed defibrillation; PCI x3  . Heart attack Father   . Diabetes Maternal Grandmother   . Diabetes Paternal Grandmother   . Hypertension Other   . Stroke Other   . Cancer Other     prostate cancer    Wt Readings from Last 3  Encounters:  07/17/14 316 lb 12 oz (143.677 kg)  01/08/14 331 lb 6.4 oz (150.322 kg)  01/02/14 332 lb 8 oz (150.821 kg)    PHYSICAL EXAM BP 110/78 mmHg  Pulse 83  Ht 5\' 10"  (1.778 m)  Wt 316 lb 12 oz (143.677 kg)  BMI 45.45 kg/m2 General appearance: alert, cooperative, appears stated age, no distress, he continues to be "morbidly obese" - but notably less than last visit. Well-groomed, pleasant mood and affect but anxious  Neck: no adenopathy, no carotid bruit, supple, symmetrical, trachea midline and Unable to assess JVP due to body habitus  Lungs: CTAB, normal percussion bilaterally and Nonlabored, good air movement  Heart: Distant heart sounds,RRR with ectopy, no obvious M./R./G. with distant sounds. Unable to palpate PMI  Abdomen: soft, non-tender; bowel sounds normal; no masses, no organomegaly and Morbid truncal obesity  Extremities: No C/C/E.  Pulses: 2+ and symmetric  Neurologic/PSYCH: Alert and oriented X 3, normal strength and tone. Normal symmetric reflexes. Normal coordination and gait  Mental status: Alert, oriented, thought content appropriate; pleasant mood and affect Cranial nerves: normal   Adult ECG Report  Rate: 83 ;  Rhythm: normal sinus rhythm  Narrative Interpretation: Normal EKG - no this time intervals, durations in voltage.  Recent Labs:   Lab Results  Component Value Date   CHOL 184 10/22/2013   HDL 34.90* 10/22/2013   LDLCALC 119* 10/22/2013   TRIG 149.0 10/22/2013   CHOLHDL 5 10/22/2013    ASSESSMENT / PLAN: Problem List Items Addressed This Visit    Exercise intolerance    Continues to improve. Now able to tolerate increased levels of exercise.      Exertional dyspnea    Much improved with weight loss. Not likely to be cardiac in nature based on normal TM Stress Echo & 2D Echo      PVC's (premature ventricular contractions) - Primary (Chronic)    Much less prominent now on. He thinks a lot of it had to do with his stress with her job  situation. He thinks it partly the reassurance from his initial evaluation is made in that much attention to what he does feel some palpitations. Normal echo and normal treadmill echo, likely benign.      Relevant Orders   EKG 12-Lead (Completed)   Severe obesity (BMI >= 40) (Chronic)    He is doing an outstanding job with his weight loss. He has been focusing on dietary intake and exercising routinely. He is halfway to his annual goal in 6 months which clips in exactly on target.         Orders Placed This Encounter  Procedures  . EKG 12-Lead    Order Specific Question:  Where should this test be performed    Answer:  [facility]-   Meds ordered this encounter  Medications  . amoxicillin-clavulanate (AUGMENTIN) 875-125 MG per tablet    Sig:  Take 1 tablet by mouth 2 (two) times daily.   Marland Kitchen PROAIR HFA 108 (90 BASE) MCG/ACT inhaler    Sig: as needed.  . benzonatate (TESSALON) 200 MG capsule    Sig: Take 200 mg by mouth as needed.    medication lists was adjusted - not ordered during this visit  Followup: one year    HARDING, Leonie Green, M.D., M.S. Interventional Cardiologist   Pager # [phone]

## 2014-07-18 NOTE — Assessment & Plan Note (Signed)
He is doing an outstanding job with his weight loss. He has been focusing on dietary intake and exercising routinely. He is halfway to his annual goal in 6 months which clips in exactly on target.

## 2014-10-25 ENCOUNTER — Encounter: Payer: BC Managed Care – PPO | Admitting: Internal Medicine

## 2014-11-06 ENCOUNTER — Encounter: Payer: Self-pay | Admitting: Internal Medicine

## 2014-12-10 ENCOUNTER — Encounter: Payer: Self-pay | Admitting: Internal Medicine

## 2014-12-10 ENCOUNTER — Ambulatory Visit (INDEPENDENT_AMBULATORY_CARE_PROVIDER_SITE_OTHER): Payer: BLUE CROSS/BLUE SHIELD | Admitting: Internal Medicine

## 2014-12-10 VITALS — BP 120/80 | HR 65 | Temp 97.8°F | Ht 69.5 in | Wt 310.0 lb

## 2014-12-10 DIAGNOSIS — Z0000 Encounter for general adult medical examination without abnormal findings: Secondary | ICD-10-CM | POA: Diagnosis not present

## 2014-12-10 LAB — GLUCOSE, RANDOM: GLUCOSE: 97 mg/dL (ref 70–99)

## 2014-12-10 LAB — LIPID PANEL
CHOL/HDL RATIO: 4
CHOLESTEROL: 170 mg/dL (ref 0–200)
HDL: 38.1 mg/dL — ABNORMAL LOW (ref >39.00)
LDL CALC: 115 mg/dL — AB (ref 0–99)
NonHDL: 131.61
Triglycerides: 84 mg/dL (ref 0.0–149.0)
VLDL: 16.8 mg/dL (ref 0.0–40.0)

## 2014-12-10 NOTE — Assessment & Plan Note (Signed)
Healthy but still lots of situational stress with work Discussed advocating for himself to reduce work load IBS type symptoms from stress No true mood disorder

## 2014-12-10 NOTE — Progress Notes (Signed)
Subjective:    Patient ID: Elijah Logan, male    DOB: 1981-02-10, 34 y.o.   MRN: [id number]  HPI Here for physical  Doing okay Has been sick for the past 3 weeks though Started as fatigue and then nasty nasal and throat drainage Feels better but still coughing Day---wet and wheezy. DM helps Never had fever Seen at urgent care a week ago--just a virus Some SOB during illness---improved now. Felt like congestion in chest.  Considering a vasectomy He will call [facility]. Currently using condoms-- they are compliant with this. Could add foam if they wanted  No recent palpitations No chest pain Still sleeping with the CPAP  Job still very stressful Awakening with upset stomach many mornings Loose stools in AM often No blood in stool No outright abdominal pain Appetite is generally okay---off at times Not depressed---loves being around his family  Has been working out more since January Not much in past month though Weight down 20# over the past 1-2 years  Current Outpatient Prescriptions on File Prior to Visit  Medication Sig Dispense Refill  . PROAIR HFA 108 (90 BASE) MCG/ACT inhaler as needed.     No current facility-administered medications on file prior to visit.    No Known Allergies  Past Medical History  Diagnosis Date  . Hyperlipidemia   . Moderate obesity   . Chronic low back pain     Has spina bifida occulta  . Allergic rhinitis due to pollen     mild  . OSA on CPAP   . History of kidney stones   . Heart palpitations     PVCs noted on Event Monitor - some in Bigeminy or couplets.  . Bulging lumbar disc     Past Surgical History  Procedure Laterality Date  . Open anterior shoulder reconstruction  1998    Right  . Transthoracic echocardiogram  12/28/2013    EF 55-60%.  Normal diastolic function  . Treadmill stress echo  01/02/2014    Ex 10:31 Min; 12.5 METS --> no EKG changes, Normal pre & post Echo Wall Motion. -- No ischemia or infarction     Family History  Problem Relation Age of Onset  . Diabetes Mother     borderline  . Diabetes Father     borderline  . Heart disease Father     MI and needed defibrillation; PCI x3  . Heart attack Father   . Diabetes Maternal Grandmother   . Diabetes Paternal Grandmother   . Hypertension Other   . Stroke Other   . Cancer Other     prostate cancer    History   Social History  . Marital Status: Married    Spouse Name: N/A  . Number of Children: 2  . Years of Education: N/A   Occupational History  . 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 Smoking status: Never Smoker   . Smokeless tobacco: Never Used  . Alcohol Use: Yes     Comment: 0-1week  . Drug Use: No  . Sexual Activity: Not on file   Other Topics Concern  . Not on file   Social History Narrative   He is a married father of 2 - ages 52 years and 5 months.   He currently works for [employer] in [place]. His wife is a Designer, jewellery who is with him today.   He has  never smoked, and does not drink alcohol.   Review of Systems  Constitutional: Negative for fatigue.       Wears seat belt  HENT: Negative for dental problem, hearing loss and tinnitus.        Keeps up with dentist  Eyes: Negative for visual disturbance.       No diplopia or unilateral vision loss  Respiratory: Negative for chest tightness and shortness of breath.   Cardiovascular: Positive for palpitations. Negative for chest pain.       Palpitations with stress Some evening edema  Gastrointestinal: Negative for nausea, vomiting, constipation and blood in stool.  Endocrine: Negative for polydipsia and polyuria.  Genitourinary: Negative for urgency and difficulty urinating.       No sexual problems  Musculoskeletal: Positive for back pain. Negative for joint swelling and arthralgias.  Skin: Negative for rash.       Has red mole on right arm   Allergic/Immunologic: Positive for environmental allergies. Negative for immunocompromised state.       Uses loratadine  Neurological: Positive for numbness. Negative for dizziness, syncope, weakness and light-headedness.       Right anterior numbness from past back problems  Hematological: Negative for adenopathy. Does not bruise/bleed easily.  Psychiatric/Behavioral: Negative for sleep disturbance and dysphoric mood.       Lots of stress Sleeps well with CPAP       Objective:   Physical Exam  Constitutional: He is oriented to person, place, and time. He appears well-developed and well-nourished. No distress.  HENT:  Head: Normocephalic and atraumatic.  Right Ear: External ear normal.  Left Ear: External ear normal.  Mouth/Throat: Oropharynx is clear and moist. No oropharyngeal exudate.  Eyes: Conjunctivae and EOM are normal. Pupils are equal, round, and reactive to light.  Neck: Normal range of motion. Neck supple. No thyromegaly present.  Cardiovascular: Normal rate, regular rhythm, normal heart sounds and intact distal pulses.  Exam reveals no gallop.   No murmur heard. Pulmonary/Chest: Effort normal. No respiratory distress. He has no wheezes. He has no rales.  Abdominal: Soft. There is no tenderness.  Musculoskeletal: He exhibits no edema or tenderness.  Lymphadenopathy:    He has no cervical adenopathy.  Neurological: He is alert and oriented to person, place, and time.  Skin: No rash noted. No erythema.  Psychiatric: He has a normal mood and affect. His behavior is normal.          Assessment & Plan:

## 2014-12-10 NOTE — Progress Notes (Signed)
Pre visit review using our clinic review tool, if applicable. No additional management support is needed unless otherwise documented below in the visit note. 

## 2014-12-10 NOTE — Assessment & Plan Note (Signed)
Has lost some weight Trying to do more exercise, etc

## 2015-01-10 ENCOUNTER — Encounter: Payer: Self-pay | Admitting: Internal Medicine

## 2015-01-10 ENCOUNTER — Ambulatory Visit: Payer: BC Managed Care – PPO | Admitting: Pulmonary Disease

## 2015-01-10 ENCOUNTER — Ambulatory Visit (INDEPENDENT_AMBULATORY_CARE_PROVIDER_SITE_OTHER): Payer: BLUE CROSS/BLUE SHIELD | Admitting: Internal Medicine

## 2015-01-10 VITALS — BP 124/72 | HR 73 | Ht 70.5 in | Wt 314.0 lb

## 2015-01-10 DIAGNOSIS — J301 Allergic rhinitis due to pollen: Secondary | ICD-10-CM

## 2015-01-10 DIAGNOSIS — G4733 Obstructive sleep apnea (adult) (pediatric): Secondary | ICD-10-CM

## 2015-01-10 NOTE — Progress Notes (Signed)
* [facility]     Assessment and Plan:  Obstructive sleep apnea. -He's been doing quite well with his CPAP using it every night, he's been having some problems with mask leakage and his masks breaking down quickly. -No residual symptoms of excessive daytime sleepiness, snoring has resolved. -Advised him to use only water to rinse his masks daily and use soap and water once a week. -Also advised that he could potentially that some of his supplies online if they're costing too much through his provider or if his insurance is not covering them. -Follow-up in one year, we will renew CPAP supplies today.  Allergic rhinitis -Advised to use Flonase or Rhinocort 2 sprays in each nostril every night. Putting these by his CPAP on his nightstand could help him remember to use them more regularly. Continue Claritin   Date: 01/10/2015  MRN# [id number] Rishawn Walck 10-Oct-1980   Kelsen Celona is a 34 y.o. old male seen in follow up for chief complaint of  Chief Complaint  Patient presents with  . Follow-up    former [facility] pt being treated for OSA.  pt wearing cpap approx 7 hours nightly.  pt notes some facial irritation from his mask.  is interested in a mask fit visit at the sleep center.      HPI:  The patient is a 56-year--year-old male, with history of obstructive sleep apnea. He is on a CPAP every night, puts it on at 1030 pm, wakes at 530 and wears it the entire night. He does note some problems with mask leakage, he wears a full face mask and has some leakage. He rinses it daily with soap, and his masks typically last for about 2 months. He notes that his CPAP readings show a residual AHI of 3. His symptoms of excessive daytime sleepiness and snoring have resolved. He has been having a runny nose, and has been constant for the past few years. He notes that this occasionally progresses to bronchitis.  He takes flonase every 2 sprays each nostril every other day. He's been having  chronic rhinitis. He also takes Claritin daily.  Review of old home sleep study on 11/30/12 showed AHI of 68.   Medication:   Current Outpatient Rx  Name  Route  Sig  Dispense  Refill  . PROAIR HFA 108 (90 BASE) MCG/ACT inhaler      as needed.           Dispense as written.      Allergies:  Review of patient's allergies indicates no known allergies.  Review of Systems: Gen:  Denies  fever, sweats. HEENT: Denies blurred vision. Cvc:  No dizziness, chest pain or heaviness Resp:   Denies cough or sputum porduction. Gi: Denies swallowing difficulty, stomach pain. constipation, bowel incontinence Gu:  Denies bladder incontinence, burning urine Ext:   No Joint pain, stiffness. Skin: No skin rash, easy bruising. Endoc:  No polyuria, polydipsia. Psych: No depression, insomnia. Other:  All other systems were reviewed and found to be negative other than what is mentioned in the HPI.   Physical Examination:   VS: BP 124/72 mmHg  Pulse 73  Ht 5' 10.5" (1.791 m)  Wt 142.429 kg (314 lb)  BMI 44.40 kg/m2  SpO2 95%  General Appearance: No distress  Neuro:without focal findings,  speech normal,  HEENT: PERRLA, EOM intact. Pulmonary: normal breath sounds, No wheezing.   CardiovascularNormal S1,S2.  No m/r/g.   Abdomen: Benign, Soft, non-tender. Renal:  No costovertebral tenderness  GU:  Not performed at this time. Endoc: No evident thyromegaly, no signs of acromegaly. Skin:   warm, no rash. Extremities: normal, no cyanosis, clubbing.   LABORATORY PANEL:   CBC No results for input(s): WBC, HGB, HCT, PLT in the last 168 hours. ------------------------------------------------------------------------------------------------------------------  Chemistries  No results for input(s): NA, K, CL, CO2, GLUCOSE, BUN, CREATININE, CALCIUM, MG, AST, ALT, ALKPHOS, BILITOT in the last 168 hours.  Invalid input(s):  GFRCGP ------------------------------------------------------------------------------------------------------------------  Cardiac Enzymes No results for input(s): TROPONINI in the last 168 hours. ------------------------------------------------------------  RADIOLOGY:  No results found.   Orders for this visit: No orders of the defined types were placed in this encounter.     Thank  you for allowing [facility] to assist in the care of your patient. Our recommendations are noted above.  Please contact us if we can be of further service.   Marda Stalker, MD.  [facility] Office Number: [phone]  Patricia Pesa, M.D.  Vilinda Boehringer, M.D.  Cheral Marker, M.D

## 2015-04-17 ENCOUNTER — Ambulatory Visit: Payer: BLUE CROSS/BLUE SHIELD | Admitting: Internal Medicine

## 2015-05-06 ENCOUNTER — Encounter: Payer: Self-pay | Admitting: Primary Care

## 2015-05-06 ENCOUNTER — Ambulatory Visit (INDEPENDENT_AMBULATORY_CARE_PROVIDER_SITE_OTHER): Payer: BLUE CROSS/BLUE SHIELD | Admitting: Primary Care

## 2015-05-06 VITALS — BP 124/76 | HR 90 | Temp 98.1°F | Ht 70.5 in | Wt 324.0 lb

## 2015-05-06 DIAGNOSIS — R509 Fever, unspecified: Secondary | ICD-10-CM | POA: Diagnosis not present

## 2015-05-06 NOTE — Progress Notes (Signed)
Subjective:    Patient ID: Elijah Logan, male    DOB: 1981/03/16, 35 y.o.   MRN: [id number]  HPI  Mr. Muneton is a 35 year old male who presents today with a chief complaint of fever. He also reports headache, lethargy, night sweats, body aches. His fever has been intermittent since Christmas Day. His fevers have been low grade with highest fever of 101. He was on a cruise through December 23rd and [place] over international [place]. He woke up this morning with cold sweats. He's been taking tylenol and iburpofen. Denies cough, sore throat, abdominal pain, sinus pressure, nausea.   Review of Systems  Constitutional: Positive for fever, chills and fatigue.  HENT: Negative for congestion, ear pain, sinus pressure and sore throat.   Respiratory: Negative for cough.   Gastrointestinal: Negative for nausea.       Past Medical History  Diagnosis Date  . Hyperlipidemia   . Moderate obesity   . Chronic low back pain     Has spina bifida occulta  . Allergic rhinitis due to pollen     mild  . OSA on CPAP   . History of kidney stones   . Heart palpitations     PVCs noted on Event Monitor - some in Bigeminy or couplets.  . Bulging lumbar disc     Social History   Social History  . Marital Status: Married    Spouse Name: N/A  . Number of Children: 2  . Years of Education: N/A   Occupational History  . 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 Smoking status: Never Smoker   . Smokeless tobacco: Never Used  . Alcohol Use: Yes     Comment: 0-1week  . Drug Use: No  . Sexual Activity: Not on file   Other Topics Concern  . Not on file   Social History Narrative      He currently works for [employer] in [place]. His wife is a Designer, jewellery at [employer]    Past Surgical History  Procedure Laterality Date  . Open anterior shoulder reconstruction  1998    Right  . Transthoracic  echocardiogram  12/28/2013    EF 55-60%.  Normal diastolic function  . Treadmill stress echo  01/02/2014    Ex 10:31 Min; 12.5 METS --> no EKG changes, Normal pre & post Echo Wall Motion. -- No ischemia or infarction    Family History  Problem Relation Age of Onset  . Diabetes Mother     borderline  . Diabetes Father     borderline  . Heart disease Father     MI and needed defibrillation; PCI x3  . Heart attack Father   . Diabetes Maternal Grandmother   . Diabetes Paternal Grandmother   . Hypertension Other   . Stroke Other   . Cancer Other     prostate cancer    No Known Allergies  Current Outpatient Prescriptions on File Prior to Visit  Medication Sig Dispense Refill  . PROAIR HFA 108 (90 BASE) MCG/ACT inhaler Inhale 2 puffs into the lungs as needed. Reported on 05/06/2015     No current facility-administered medications on file prior to visit.    BP 124/76 mmHg  Pulse 90  Temp(Src) 98.1 F (36.7 C) (Oral)  Ht 5' 10.5" (1.791 m)  Wt 324 lb (146.965 kg)  BMI 45.82 kg/m2  SpO2 97%  Objective:   Physical Exam  Constitutional: He appears well-nourished.  HENT:  Right Ear: Tympanic membrane and ear canal normal.  Left Ear: Tympanic membrane and ear canal normal.  Nose: Right sinus exhibits no maxillary sinus tenderness and no frontal sinus tenderness. Left sinus exhibits no maxillary sinus tenderness and no frontal sinus tenderness.  Mouth/Throat: Oropharynx is clear and moist.  Eyes: Conjunctivae are normal.  Neck: Neck supple.  Cardiovascular: Normal rate and regular rhythm.   Pulmonary/Chest: Effort normal and breath sounds normal.  Lymphadenopathy:    He has no cervical adenopathy.  Skin: Skin is warm and dry.  Vitals reviewed.         Assessment & Plan:  Fever:  Intermittent since 04/27/15, recently traveled on cruise. Temporary relief with tylenol and ibuprofen, however continues to experience body aches, chills, night sweats. No cough, sore  throat, other respiratory symtoms. Suspect viral in nature, however will obtain lab work to confirm. CBC with diff, CMP, Mono tests pending. Increase fluids, rest, return precautions provided.

## 2015-05-06 NOTE — Progress Notes (Signed)
Pre visit review using our clinic review tool, if applicable. No additional management support is needed unless otherwise documented below in the visit note. 

## 2015-05-06 NOTE — Patient Instructions (Signed)
Complete lab work prior to leaving today. I will notify you of your results once received.   Increase consumption of fluids.  You may alternate tylenol and ibuprofen for body aches and fevers. Do not exceed 3000 mg of tylenol and 2400 mg of ibuprofen in 24 hours.  I'll be in touch soon.  It was a pleasure meeting you!

## 2015-05-07 LAB — CBC WITH DIFFERENTIAL/PLATELET
BASOS ABS: 0 10*3/uL (ref 0.0–0.1)
Basophils Relative: 0.3 % (ref 0.0–3.0)
Eosinophils Absolute: 0.1 10*3/uL (ref 0.0–0.7)
Eosinophils Relative: 2 % (ref 0.0–5.0)
HCT: 42.7 % (ref 39.0–52.0)
Hemoglobin: 14.3 g/dL (ref 13.0–17.0)
LYMPHS PCT: 50 % — AB (ref 12.0–46.0)
Lymphs Abs: 2.2 10*3/uL (ref 0.7–4.0)
MCHC: 33.5 g/dL (ref 30.0–36.0)
MCV: 87 fl (ref 78.0–100.0)
MONOS PCT: 10.4 % (ref 3.0–12.0)
Monocytes Absolute: 0.5 10*3/uL (ref 0.1–1.0)
NEUTROS PCT: 37.3 % — AB (ref 43.0–77.0)
Neutro Abs: 1.7 10*3/uL (ref 1.4–7.7)
Platelets: 150 10*3/uL (ref 150.0–400.0)
RBC: 4.91 Mil/uL (ref 4.22–5.81)
RDW: 13.8 % (ref 11.5–15.5)
WBC: 4.5 10*3/uL (ref 4.0–10.5)

## 2015-05-07 LAB — COMPREHENSIVE METABOLIC PANEL
ALK PHOS: 66 U/L (ref 39–117)
ALT: 68 U/L — AB (ref 0–53)
AST: 35 U/L (ref 0–37)
Albumin: 4 g/dL (ref 3.5–5.2)
BILIRUBIN TOTAL: 0.6 mg/dL (ref 0.2–1.2)
BUN: 10 mg/dL (ref 6–23)
CO2: 28 mEq/L (ref 19–32)
Calcium: 9 mg/dL (ref 8.4–10.5)
Chloride: 105 mEq/L (ref 96–112)
Creatinine, Ser: 0.95 mg/dL (ref 0.40–1.50)
GFR: 96.39 mL/min (ref >60.00)
GLUCOSE: 88 mg/dL (ref 70–99)
Potassium: 4.1 mEq/L (ref 3.5–5.1)
Sodium: 141 mEq/L (ref 135–145)
TOTAL PROTEIN: 6.8 g/dL (ref 6.0–8.3)

## 2015-05-07 LAB — MONONUCLEOSIS SCREEN: MONO SCREEN: NEGATIVE

## 2015-05-23 ENCOUNTER — Encounter: Payer: Self-pay | Admitting: Physician Assistant

## 2015-05-23 ENCOUNTER — Ambulatory Visit (INDEPENDENT_AMBULATORY_CARE_PROVIDER_SITE_OTHER): Payer: BLUE CROSS/BLUE SHIELD | Admitting: Internal Medicine

## 2015-05-23 ENCOUNTER — Encounter: Payer: Self-pay | Admitting: Internal Medicine

## 2015-05-23 VITALS — BP 126/76 | HR 80 | Temp 98.3°F | Wt 315.8 lb

## 2015-05-23 DIAGNOSIS — R109 Unspecified abdominal pain: Secondary | ICD-10-CM | POA: Insufficient documentation

## 2015-05-23 DIAGNOSIS — R1010 Upper abdominal pain, unspecified: Secondary | ICD-10-CM | POA: Diagnosis not present

## 2015-05-23 DIAGNOSIS — R509 Fever, unspecified: Secondary | ICD-10-CM

## 2015-05-23 LAB — HEPATIC FUNCTION PANEL
ALK PHOS: 50 U/L (ref 39–117)
ALT: 43 U/L (ref 0–53)
AST: 24 U/L (ref 0–37)
Albumin: 4 g/dL (ref 3.5–5.2)
BILIRUBIN DIRECT: 0.1 mg/dL (ref 0.0–0.3)
TOTAL PROTEIN: 6.8 g/dL (ref 6.0–8.3)
Total Bilirubin: 0.7 mg/dL (ref 0.2–1.2)

## 2015-05-23 NOTE — Progress Notes (Signed)
Pre visit review using our clinic review tool, if applicable. No additional management support is needed unless otherwise documented below in the visit note. 

## 2015-05-23 NOTE — Assessment & Plan Note (Signed)
With some systemic and GI features---after [place] cruise Suspect Hep A or other viral (noro) Not malaria prone area so doubt this

## 2015-05-23 NOTE — Assessment & Plan Note (Signed)
Going on 6 months Most likely IBS type thing--and he notes lots of stress Will set up with GI just in case
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
## 2015-05-24 LAB — HEPATITIS A ANTIBODY, TOTAL: HEP A TOTAL AB: NONREACTIVE

## 2015-05-26 ENCOUNTER — Ambulatory Visit: Payer: BLUE CROSS/BLUE SHIELD | Admitting: Internal Medicine

## 2015-05-30 ENCOUNTER — Ambulatory Visit: Payer: BLUE CROSS/BLUE SHIELD | Admitting: Physician Assistant

## 2015-06-24 ENCOUNTER — Ambulatory Visit: Payer: BLUE CROSS/BLUE SHIELD | Admitting: Physician Assistant

## 2015-09-02 NOTE — Progress Notes (Signed)
PCP: Viviana Simpler, MD  Clinic Note: Chief Complaint  Patient presents with  . Follow-up    1 yr f/u. Meds reviewed verbally with pt.  . Palpitations    Symptomatic PVCs    HPI: Elijah Logan is a 35 y.o. male with a PMH below who presents today for annual f/u or heart palpitations & dyspnea/chest tightness.  As you recall he is a morbidly obese young man on CPAP for OSA.  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 He was referred for evaluation of a sensation of his heart was fluttering out of control after wrestling with his 43 year-old son. Since then he is had multiple episodes of his heart pounding and pulsating. He is chest tightness as well as dyspnea without palpitations. He was evaluated with an event monitor that showed only minimal ectopy with PVCs occasionally couplets and bigeminy but no true arrhythmias. His baseline echocardiogram and stress echocardiogram were unremarkable.  Elijah Logan was last seen in March 2016 --> had lost 15 lb.  No complaints.  No further CP & palpitations were controlled.  Recent Hospitalizations: n/a  Studies Reviewed: none  Interval History: Elijah Logan presents today doing fairly well overall from a cardiac standpoint. He says that over last month or so the work-related stress has increased little bit, but he seems to be doing a good job keeping his symptoms at [place]. He basically is able to take a deep breath and extract himself from a stressful situation that usually alleviates the palpitations. Very rare palpitations & only with significant stress - usually < 1 min. Controls with deep breath & "diffuses" the situation.  Good cough. Job now more stressful though, so a bit more frequent over past few months. (In charge of a big re-design). He has gotten better @ handling it, but his manager keeps nagging.  Wgt loss challenge - lost to 305 ->up to 318 &  then to 308 - now 320. Did 5 K 1 yr ago.    Has not had anymore of the chest pressure or tightness episodes. Because his godmother shape gained back his weight, he startinglittle bit more exertional dyspnea, but still nothing like it was before.  Remainder his cardiovascular review of symptoms as follows: No chest pain or shortness of breath with rest or exertion.  No PND, orthopnea or edema.  No lightheadedness, dizziness, weakness or syncope/near syncope. No TIA/amaurosis fugax symptoms.   ROS: A comprehensive was performed. Review of Systems  Constitutional: Negative for weight loss and malaise/fatigue.  Respiratory: Negative for cough, shortness of breath and wheezing.   Cardiovascular: Negative for claudication.  Gastrointestinal: Positive for heartburn. Negative for abdominal pain, constipation, blood in stool and melena.  Genitourinary: Negative for hematuria.  Musculoskeletal: Negative for myalgias and joint pain.  Psychiatric/Behavioral: The patient is nervous/anxious (Dealing with this much better).   All other systems reviewed and are negative.   Past Medical History  Diagnosis Date  . Hyperlipidemia   . Moderate obesity   . Chronic low back pain     Has spina bifida occulta  . Allergic rhinitis due to pollen     mild  . OSA on CPAP   . History of kidney stones   . Heart palpitations     PVCs noted on Event Monitor - some in Bigeminy or couplets.  . Bulging lumbar disc  Past Surgical History  Procedure Laterality Date  . Open anterior shoulder reconstruction  1998    Right  . Transthoracic echocardiogram  12/28/2013    EF 55-60%.  Normal diastolic function  . Treadmill stress echo  01/02/2014    Ex 10:31 Min; 12.5 METS --> no EKG changes, Normal pre & post Echo Wall Motion. -- No ischemia or infarction   Prior to Admission medications   None    No Known Allergies   Social History   Social History  . Marital Status: Married    Spouse Name: N/A  .  Number of Children: 2  . Years of Education: N/A   Occupational History  . 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 Smoking status: Never Smoker   . Smokeless tobacco: Never Used  . Alcohol Use: Yes     Comment: 0-1week  . Drug Use: No  . Sexual Activity: Not Asked   Other Topics Concern  . None   Social History Narrative      He currently works for [employer] in [place]. His wife is a Designer, jewellery at [employer] History  Problem Relation Age of Onset  . Diabetes Mother     borderline  . Diabetes Father     borderline  . Heart disease Father     MI and needed defibrillation; PCI x3  . Heart attack Father   . Diabetes Maternal Grandmother   . Diabetes Paternal Grandmother   . Hypertension Other   . Stroke Other   . Cancer Other     prostate cancer    Wt Readings from Last 3 Encounters:  09/03/15 322 lb (146.058 kg)  05/23/15 315 lb 12 oz (143.223 kg)  05/06/15 324 lb (146.965 kg)    PHYSICAL EXAM BP 120/78 mmHg  Pulse 70  Ht 5\' 10"  (1.778 m)  Wt 322 lb (146.058 kg)  BMI 46.20 kg/m2 General appearance: alert, cooperative, appears stated age, no distress, he continues to be "morbidly obese" - but notably less than last visit. Well-groomed, pleasant mood and affect but anxious  Neck: no adenopathy, no carotid bruit, supple, symmetrical, trachea midline and Unable to assess JVP due to body habitus  Lungs: CTAB, normal percussion bilaterally and Nonlabored, good air movement  Heart: Distant heart sounds,RRR with ectopy, no obvious M./R./G. with distant sounds. Unable to palpate PMI  Abdomen: soft, non-tender; bowel sounds normal; no masses, no organomegaly and Morbid truncal obesity  Extremities: No C/C/E. Pulses: 2+ and symmetric  Neurologic/PSYCH: Alert and oriented X 3, normal strength and tone. Normal symmetric reflexes. Normal coordination and gait   Adult  ECG Report  Rate: 70 ;  Rhythm: normal sinus rhythm and With borderline incomplete right bundle branch block; Otherwise normal axis, intervals and durations.  Narrative Interpretation: Stable EKG   Other studies Reviewed: Additional studies/ records that were reviewed today include:  Recent Labs:  Monitored by PCP    ASSESSMENT / PLAN: Problem List Items Addressed This Visit    Severe obesity (BMI >= 40) (HCC) (Chronic)    The patient understands the need to lose weight with diet and exercise. We have discussed specific strategies for this.      PVC's (premature ventricular contractions) - Primary (Chronic)    Pretty much improved overall. He seems to be able to keep these symptoms now at bay by eating able to use  biofeedback techniques that we discussed to calm himself down. We both agreed that he would prefer not to use medications to avoid any side effects. He has had a normal echo and normal treadmill echo so these symptoms are still likely benign.      Relevant Orders   EKG 12-Lead      Current medicines are reviewed at length with the patient today. (+/- concerns) None The following changes have been made: None  Studies Ordered:   Orders Placed This Encounter  Procedures  . EKG 12-Lead   Follow-up in one year. If doing well at that time acceptable to follow-up after another year.   Leonie Man, M.D., M.S. Interventional Cardiologist   Pager # [phone] Phone # [phone] [address] [place], [place] [address]

## 2015-09-03 ENCOUNTER — Encounter: Payer: Self-pay | Admitting: Cardiology

## 2015-09-03 ENCOUNTER — Ambulatory Visit (INDEPENDENT_AMBULATORY_CARE_PROVIDER_SITE_OTHER): Payer: BLUE CROSS/BLUE SHIELD | Admitting: Cardiology

## 2015-09-03 VITALS — BP 120/78 | HR 70 | Ht 70.0 in | Wt 322.0 lb

## 2015-09-03 DIAGNOSIS — I493 Ventricular premature depolarization: Secondary | ICD-10-CM

## 2015-09-03 NOTE — Patient Instructions (Signed)
Medication Instructions:  Your physician recommends that you continue on your current medications as directed. Please refer to the Current Medication list given to you today.   Labwork: none  Testing/Procedures: none  Follow-Up: Your physician wants you to follow-up in: one year with Dr. Harding.  You will receive a reminder letter in the mail two months in advance. If you don't receive a letter, please call our office to schedule the follow-up appointment.   Any Other Special Instructions Will Be Listed Below (If Applicable).     If you need a refill on your cardiac medications before your next appointment, please call your pharmacy.   

## 2015-09-05 ENCOUNTER — Encounter: Payer: Self-pay | Admitting: Cardiology

## 2015-09-05 NOTE — Assessment & Plan Note (Signed)
The patient understands the need to lose weight with diet and exercise. We have discussed specific strategies for this.  

## 2015-09-05 NOTE — Assessment & Plan Note (Signed)
Pretty much improved overall. He seems to be able to keep these symptoms now at bay by eating able to use biofeedback techniques that we discussed to calm himself down. We both agreed that he would prefer not to use medications to avoid any side effects. He has had a normal echo and normal treadmill echo so these symptoms are still likely benign.

## 2015-09-18 DIAGNOSIS — B079 Viral wart, unspecified: Secondary | ICD-10-CM | POA: Diagnosis not present

## 2015-09-18 DIAGNOSIS — D235 Other benign neoplasm of skin of trunk: Secondary | ICD-10-CM | POA: Diagnosis not present

## 2015-09-18 DIAGNOSIS — L814 Other melanin hyperpigmentation: Secondary | ICD-10-CM | POA: Diagnosis not present

## 2015-09-18 DIAGNOSIS — B351 Tinea unguium: Secondary | ICD-10-CM | POA: Diagnosis not present

## 2015-09-19 DIAGNOSIS — B351 Tinea unguium: Secondary | ICD-10-CM | POA: Diagnosis not present

## 2015-10-16 DIAGNOSIS — B079 Viral wart, unspecified: Secondary | ICD-10-CM | POA: Diagnosis not present

## 2015-11-20 DIAGNOSIS — G4733 Obstructive sleep apnea (adult) (pediatric): Secondary | ICD-10-CM | POA: Diagnosis not present

## 2015-11-21 DIAGNOSIS — B079 Viral wart, unspecified: Secondary | ICD-10-CM | POA: Diagnosis not present

## 2015-12-19 ENCOUNTER — Encounter: Payer: Self-pay | Admitting: Internal Medicine

## 2015-12-19 ENCOUNTER — Ambulatory Visit (INDEPENDENT_AMBULATORY_CARE_PROVIDER_SITE_OTHER): Payer: BLUE CROSS/BLUE SHIELD | Admitting: Internal Medicine

## 2015-12-19 DIAGNOSIS — G8929 Other chronic pain: Secondary | ICD-10-CM

## 2015-12-19 DIAGNOSIS — K589 Irritable bowel syndrome without diarrhea: Secondary | ICD-10-CM | POA: Diagnosis not present

## 2015-12-19 DIAGNOSIS — M545 Low back pain: Secondary | ICD-10-CM | POA: Diagnosis not present

## 2015-12-19 DIAGNOSIS — Z0000 Encounter for general adult medical examination without abnormal findings: Secondary | ICD-10-CM | POA: Diagnosis not present

## 2015-12-19 MED ORDER — HYOSCYAMINE SULFATE 0.125 MG SL SUBL: 0.1250 mg | SUBLINGUAL_TABLET | SUBLINGUAL | 3 refills | Status: DC | PRN

## 2015-12-19 NOTE — Assessment & Plan Note (Signed)
Classic history  Will try SL levsin

## 2015-12-19 NOTE — Progress Notes (Signed)
Subjective:    Patient ID: Elijah Logan, male    DOB: 06-23-1980, 35 y.o.   MRN: [id number]  HPI Here for physical  Having left shoulder problems Will get inflamed if he sleeps on it wrong Particular spot on top that gets inflamed and very tender Takes aleve and it will ease up over a few hours or days  Chronic back problems Uses the aleve as needed  Still having daily abdominal problems Chronic diarrhea---not sick Nerves seem to cause it Some heartburn--but usually feels like "butterflies" and then has rectal urgency Goes 2-3 times per day No symptoms over the weekend No blood in stool Weight was down for a while--until his back got worse--then put it back on  No current outpatient prescriptions on file prior to visit.   No current facility-administered medications on file prior to visit.     No Known Allergies  Past Medical History:  Diagnosis Date  . Allergic rhinitis due to pollen    mild  . Bulging lumbar disc   . Chronic low back pain    Has spina bifida occulta  . Heart palpitations    PVCs noted on Event Monitor - some in Bigeminy or couplets.  . History of kidney stones   . Hyperlipidemia   . Moderate obesity   . OSA on CPAP     Past Surgical History:  Procedure Laterality Date  . OPEN ANTERIOR SHOULDER RECONSTRUCTION  1998   Right  . TRANSTHORACIC ECHOCARDIOGRAM  12/28/2013   EF 55-60%.  Normal diastolic function  . TREADMILL STRESS ECHO  01/02/2014   Ex 10:31 Min; 12.5 METS --> no EKG changes, Normal pre & post Echo Wall Motion. -- No ischemia or infarction    Family History  Problem Relation Age of Onset  . Diabetes Mother     borderline  . Diabetes Father     borderline  . Heart disease Father     MI and needed defibrillation; PCI x3  . Heart attack Father   . Hypertension Other   . Stroke Other   . Cancer Other     prostate cancer  . Diabetes Maternal Grandmother   . Diabetes Paternal Grandmother     Social History   Social  History  . Marital status: Married    Spouse name: N/A  . Number of children: 2  . Years of education: N/A   Occupational History  . 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 Smoking status: Never Smoker  . Smokeless tobacco: Never Used  . Alcohol use Yes     Comment: 0-1week  . Drug use: No  . Sexual activity: Not on file   Other Topics Concern  . Not on file   Social History Narrative      He currently works for [employer] in [place]. His wife is a Designer, jewellery at [employer]  Constitutional: Negative for fatigue and unexpected weight change.       Wears seat belt  HENT: Negative for dental problem, hearing loss and tinnitus.        Keeps up with dentist  Eyes: Negative for visual disturbance.       No diplopia or unilateral vision loss  Cardiovascular: Negative for chest pain, palpitations and leg swelling.  Gastrointestinal: Positive for diarrhea. Negative for blood in stool and nausea.  Endocrine: Negative for polydipsia  and polyuria.  Genitourinary: Negative for difficulty urinating and urgency.       No sexual problems  Musculoskeletal: Positive for arthralgias and back pain. Negative for joint swelling.  Skin: Negative for rash.       No suspicious lesions--does go to derm  Allergic/Immunologic: Positive for environmental allergies. Negative for immunocompromised state.       Using claritin and flonase  Neurological: Negative for dizziness, syncope and light-headedness.       Occ headaches--relates to past concussions (rare migraine now)  Hematological: Negative for adenopathy. Does not bruise/bleed easily.  Psychiatric/Behavioral: Negative for dysphoric mood and sleep disturbance. The patient is nervous/anxious.        Sleeps well with CPAP---uses all the time       Objective:   Physical Exam  Constitutional: He is oriented to person, place, and  time. He appears well-developed and well-nourished. No distress.  HENT:  Head: Normocephalic and atraumatic.  Right Ear: External ear normal.  Left Ear: External ear normal.  Mouth/Throat: Oropharynx is clear and moist. No oropharyngeal exudate.  Eyes: Conjunctivae are normal. Pupils are equal, round, and reactive to light.  Neck: Normal range of motion. Neck supple. No thyromegaly present.  Cardiovascular: Normal rate, regular rhythm, normal heart sounds and intact distal pulses.  Exam reveals no gallop.   No murmur heard. Pulmonary/Chest: Effort normal and breath sounds normal. No respiratory distress. He has no wheezes. He has no rales.  Abdominal: Soft. There is no tenderness.  Musculoskeletal: He exhibits no edema.  Left shoulder ~full ROM. Localized tenderness at triceps tendon (discussed ice and NSAIDs)  Lymphadenopathy:    He has no cervical adenopathy.  Neurological: He is alert and oriented to person, place, and time.  Skin: No rash noted. No erythema.  Psychiatric: He has a normal mood and affect. His behavior is normal.          Assessment & Plan:

## 2015-12-19 NOTE — Assessment & Plan Note (Signed)
May need to consider alternative exercise Discussed swimming and he is looking into a pool at home

## 2015-12-19 NOTE — Assessment & Plan Note (Signed)
Healthy Discussed lifestyle UTD on tetanus Yearly flu vaccine at work

## 2015-12-25 ENCOUNTER — Ambulatory Visit (INDEPENDENT_AMBULATORY_CARE_PROVIDER_SITE_OTHER): Payer: BLUE CROSS/BLUE SHIELD | Admitting: Internal Medicine

## 2015-12-25 ENCOUNTER — Encounter: Payer: Self-pay | Admitting: Internal Medicine

## 2015-12-25 DIAGNOSIS — S134XXA Sprain of ligaments of cervical spine, initial encounter: Secondary | ICD-10-CM

## 2015-12-25 DIAGNOSIS — S139XXA Sprain of joints and ligaments of unspecified parts of neck, initial encounter: Secondary | ICD-10-CM | POA: Insufficient documentation

## 2015-12-25 MED ORDER — CYCLOBENZAPRINE HCL 10 MG PO TABS: 5.0000 mg | ORAL_TABLET | Freq: Two times a day (BID) | ORAL | 0 refills | Status: DC | PRN
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
## 2015-12-25 NOTE — Assessment & Plan Note (Signed)
Will continue ibuprofen, ice tonight Cyclobenzaprine for night--prn  Also with mild closed head injury No work tomorrow if ongoing headache---discussed this Limit TV, computer till headache gone

## 2015-12-25 NOTE — Progress Notes (Signed)
Pre visit review using our clinic review tool, if applicable. No additional management support is needed unless otherwise documented below in the visit note. 

## 2015-12-25 NOTE — Patient Instructions (Addendum)
Do not go to work tomorrow if you are having headache or trouble concentrating tomorrow. Continue the ibuprofen 600mg  three times a day and ice for the next 24 hours (intermittently)

## 2016-01-29 ENCOUNTER — Ambulatory Visit (HOSPITAL_COMMUNITY)
Admission: RE | Admit: 2016-01-29 | Discharge: 2016-01-29 | Disposition: A | Discharge status: Home / Self Care | Payer: BLUE CROSS/BLUE SHIELD | Source: Ambulatory Visit | Attending: Cardiovascular Disease | Admitting: Cardiovascular Disease

## 2016-01-29 ENCOUNTER — Ambulatory Visit (INDEPENDENT_AMBULATORY_CARE_PROVIDER_SITE_OTHER): Payer: BLUE CROSS/BLUE SHIELD | Admitting: Family Medicine

## 2016-01-29 ENCOUNTER — Encounter: Payer: Self-pay | Admitting: Family Medicine

## 2016-01-29 VITALS — BP 120/90 | HR 76 | Wt 326.0 lb

## 2016-01-29 DIAGNOSIS — M79662 Pain in left lower leg: Secondary | ICD-10-CM

## 2016-01-29 NOTE — Patient Instructions (Signed)
We will call you regarding your ultrasound report

## 2016-01-29 NOTE — Progress Notes (Signed)
Subjective:    Patient ID: Elijah Logan, male    DOB: November 25, 1980, 35 y.o.   MRN: [id number]  HPI This is a 35 yo male who presents today with pain behind his left knee that started yesterday. He first noticed pain after dinner last night. Started behind knee last night and this morning pain is radiating down leg. His wife is a NP at [employer] and gave him baby aspirin x 4. He is very concerned about a possible blood clot. He has a sedentary job and commutes over an hour each day. He otherwise feels better than he has in a long time. No chest pain, no SOB. Has chronic back pain and reports this pain is different, doesn't feel musculoskeletal. Denies any injury, trauma or falls. No known personal or family history of clotting disorders.    Past Medical History:  Diagnosis Date  . Allergic rhinitis due to pollen    mild  . Bulging lumbar disc   . Chronic low back pain    Has spina bifida occulta  . Heart palpitations    PVCs noted on Event Monitor - some in [place] or couplets.  . History of kidney stones   . Hyperlipidemia   . Moderate obesity   . OSA on CPAP    Past Surgical History:  Procedure Laterality Date  . OPEN ANTERIOR SHOULDER RECONSTRUCTION  1998   Right  . TRANSTHORACIC ECHOCARDIOGRAM  12/28/2013   EF 55-60%.  Normal diastolic function  . TREADMILL STRESS ECHO  01/02/2014   Ex 10:31 Min; 12.5 METS --> no EKG changes, Normal pre & post Echo Wall Motion. -- No ischemia or infarction   Family History  Problem Relation Age of Onset  . Diabetes Mother     borderline  . Diabetes Father     borderline  . Heart disease Father     MI and needed defibrillation; PCI x3  . Heart attack Father   . Hypertension Other   . Stroke Other   . Cancer Other     prostate cancer  . Diabetes Maternal Grandmother   . Diabetes Paternal Grandmother    Social History  Substance Use Topics  . Smoking status: Never Smoker  . Smokeless tobacco: Never Used  . Alcohol use Yes     Comment:  0-1week      Review of Systems Per HPI    Objective:   Physical Exam  Constitutional: He is oriented to person, place, and time. He appears well-developed and well-nourished. No distress.  Morbidly obese.   HENT:  Head: Normocephalic and atraumatic.  Eyes: Conjunctivae are normal.  Neck: Normal range of motion.  Cardiovascular: Normal rate.   Pulmonary/Chest: Effort normal.  Musculoskeletal:       Left lower leg: He exhibits tenderness. He exhibits no bony tenderness, no swelling and no edema.  Very tender behind knee and several inches distal. No erythema. No warmth.   Neurological: He is alert and oriented to person, place, and time.  Skin: Skin is warm and dry. He is not diaphoretic.  Psychiatric: He has a normal mood and affect. His behavior is normal. Judgment and thought content normal.  Vitals reviewed.     BP 120/90   Pulse 76   Wt (!) 326 lb (147.9 kg)   SpO2 96%   BMI 46.78 kg/m  Wt Readings from Last 3 Encounters:  01/29/16 (!) 326 lb (147.9 kg)  12/25/15 (!) 331 lb (150.1 kg)  12/19/15 (!) 330 lb (149.7 kg)  Measurements Left behind knee- 38, Right behind knee- 38 Left Mid calf- 42 Right Mid calf- 42    Assessment & Plan:  1. Pain in left lower leg - acute onset of pain without known injury/trauma, due to his sedentary lifestyle, morbid obesity will r/o DVT - VAS [place] LOWER EXTREMITY VENOUS (DVT); Future  2. Morbid obesity due to excess calories (HCC) - VAS [place] LOWER EXTREMITY VENOUS (DVT); Future   Clarene Reamer, FNP-BC  [facility]  01/29/2016 8:35 AM

## 2016-02-03 ENCOUNTER — Ambulatory Visit: Payer: Self-pay | Admitting: Primary Care

## 2016-02-24 DIAGNOSIS — Z23 Encounter for immunization: Secondary | ICD-10-CM | POA: Diagnosis not present

## 2016-03-08 NOTE — Progress Notes (Signed)
* [facility]     Assessment and Plan:  Obstructive sleep apnea. -Severe OSA with AHI 68, on cpap.  -He does notice that he has some mask leaking.   Rash --On the back of neck corresponding to his CPAP straps. This may be an allergic dermatitis, or possibly secondary to sweating. -Recommend that he rinse his straps, and try moisturizing lotion at the site. If not improved. I can try some hydrocortisone ointment to the area at bedtime.  Allergic rhinitis -Continue flonase.    Date: 03/08/2016  MRN# [id number] Elijah Logan 1980/07/23   Elijah Logan is a 35 y.o. old male seen in follow up for chief complaint of  Chief Complaint  Patient presents with  . Follow-up    sleep: feels good during the day: no concerns except for mask soemtimes doesn't fell sealed    HPI:  The patient is a 35 year old male, with history of obstructive sleep apnea. He is using cpap every night for the entire night, he notes that his mask is leaking and that he has a tender rash at the back of his neck where the strap is.   He takes flonase 2 sprays each nostril  day. He's been having chronic rhinitis. He also takes Claritin prn, he used it during the summer but his symptoms have resolved.   Home sleep study on 11/30/12 showed AHI of 68.   Medication:   reviewed   Allergies:  Patient has no known allergies.  Review of Systems: Gen:  Denies  fever, sweats. HEENT: Denies blurred vision. Cvc:  No dizziness, chest pain or heaviness Resp:   Denies cough or sputum porduction. Gi: Denies swallowing difficulty, stomach pain. constipation, bowel incontinence Gu:  Denies bladder incontinence, burning urine Ext:   No Joint pain, stiffness. Skin: No skin rash, easy bruising. Endoc:  No polyuria, polydipsia. Psych: No depression, insomnia. Other:  All other systems were reviewed and found to be negative other than what is mentioned in the HPI.   Physical Examination:   VS: BP 126/86 (BP  Location: Left Arm, Cuff Size: Normal)   Pulse 76   Wt (!) 333 lb (151 kg)   SpO2 95%   BMI 47.78 kg/m   General Appearance: No distress  Neuro:without focal findings,  speech normal,  HEENT: PERRLA, EOM intact. Pulmonary: normal breath sounds, No wheezing.   CardiovascularNormal S1,S2.  No m/r/g.   Abdomen: Benign, Soft, non-tender. Renal:  No costovertebral tenderness  GU:  Not performed at this time. Endoc: No evident thyromegaly, no signs of acromegaly. Skin:   warm, no rash. Extremities: normal, no cyanosis, clubbing.   LABORATORY PANEL:   CBC No results for input(s): WBC, HGB, HCT, PLT in the last 168 hours. ------------------------------------------------------------------------------------------------------------------  Chemistries  No results for input(s): NA, K, CL, CO2, GLUCOSE, BUN, CREATININE, CALCIUM, MG, AST, ALT, ALKPHOS, BILITOT in the last 168 hours.  Invalid input(s): GFRCGP ------------------------------------------------------------------------------------------------------------------  Cardiac Enzymes No results for input(s): TROPONINI in the last 168 hours. ------------------------------------------------------------  RADIOLOGY:  No results found.   Orders for this visit: No orders of the defined types were placed in this encounter.    Thank  you for allowing [facility] to assist in the care of your patient. Our recommendations are noted above.  Please contact us if we can be of further service.   Marda Stalker, MD.   Pulmonary and Critical Care Office Number: [phone]  Patricia Pesa, M.D.  Vilinda Boehringer, M.D.  Cheral Marker, M.D

## 2016-03-09 ENCOUNTER — Encounter: Payer: Self-pay | Admitting: Internal Medicine

## 2016-03-09 ENCOUNTER — Ambulatory Visit (INDEPENDENT_AMBULATORY_CARE_PROVIDER_SITE_OTHER): Payer: BLUE CROSS/BLUE SHIELD | Admitting: Internal Medicine

## 2016-03-09 VITALS — BP 126/86 | HR 76 | Wt 333.0 lb

## 2016-03-09 DIAGNOSIS — J301 Allergic rhinitis due to pollen: Secondary | ICD-10-CM

## 2016-03-09 DIAGNOSIS — G4733 Obstructive sleep apnea (adult) (pediatric): Secondary | ICD-10-CM

## 2016-03-09 NOTE — Patient Instructions (Addendum)
--  try washing your straps, make sure the velcro is not exposed.   --You can also try moisturizing lotion to the back of your neck. If the the rash on the back of your neck becomes a problem you can try some hydrocortisone lotion.   --Will refer to mask fitting clinic.

## 2016-11-30 ENCOUNTER — Encounter: Payer: Self-pay | Admitting: Emergency Medicine

## 2016-11-30 ENCOUNTER — Emergency Department: Payer: BLUE CROSS/BLUE SHIELD

## 2016-11-30 ENCOUNTER — Emergency Department
Admission: EM | Admit: 2016-11-30 | Discharge: 2016-11-30 | Disposition: A | Discharge status: Home / Self Care | Payer: BLUE CROSS/BLUE SHIELD | Attending: Emergency Medicine | Admitting: Emergency Medicine

## 2016-11-30 DIAGNOSIS — N200 Calculus of kidney: Secondary | ICD-10-CM

## 2016-11-30 DIAGNOSIS — G8929 Other chronic pain: Secondary | ICD-10-CM | POA: Insufficient documentation

## 2016-11-30 DIAGNOSIS — Z87442 Personal history of urinary calculi: Secondary | ICD-10-CM | POA: Diagnosis not present

## 2016-11-30 DIAGNOSIS — R109 Unspecified abdominal pain: Secondary | ICD-10-CM | POA: Insufficient documentation

## 2016-11-30 DIAGNOSIS — M545 Low back pain: Secondary | ICD-10-CM | POA: Diagnosis not present

## 2016-11-30 DIAGNOSIS — K439 Ventral hernia without obstruction or gangrene: Secondary | ICD-10-CM | POA: Diagnosis not present

## 2016-11-30 DIAGNOSIS — K760 Fatty (change of) liver, not elsewhere classified: Secondary | ICD-10-CM | POA: Diagnosis not present

## 2016-11-30 LAB — BASIC METABOLIC PANEL
Anion gap: 8 (ref 5–15)
BUN: 17 mg/dL (ref 6–20)
CALCIUM: 9.2 mg/dL (ref 8.9–10.3)
CHLORIDE: 108 mmol/L (ref 101–111)
CO2: 25 mmol/L (ref 22–32)
CREATININE: 1.23 mg/dL (ref 0.61–1.24)
GFR calc Af Amer: >60 mL/min (ref >60)
GFR calc non Af Amer: >60 mL/min (ref >60)
Glucose, Bld: 107 mg/dL — ABNORMAL HIGH (ref 65–99)
Potassium: 4.4 mmol/L (ref 3.5–5.1)
SODIUM: 141 mmol/L (ref 135–145)

## 2016-11-30 LAB — URINALYSIS, COMPLETE (UACMP) WITH MICROSCOPIC
Bacteria, UA: NONE SEEN
Bilirubin Urine: NEGATIVE
GLUCOSE, UA: NEGATIVE mg/dL
Ketones, ur: NEGATIVE mg/dL
Leukocytes, UA: NEGATIVE
Nitrite: NEGATIVE
PROTEIN: NEGATIVE mg/dL
Specific Gravity, Urine: 1.011 (ref 1.005–1.030)
pH: 5 (ref 5.0–8.0)

## 2016-11-30 LAB — CBC WITH DIFFERENTIAL/PLATELET
Basophils Absolute: 0 10*3/uL (ref 0–0.1)
Basophils Relative: 1 %
EOS ABS: 0.1 10*3/uL (ref 0–0.7)
EOS PCT: 1 %
HCT: 43.1 % (ref 40.0–52.0)
HEMOGLOBIN: 14.8 g/dL (ref 13.0–18.0)
LYMPHS ABS: 1.1 10*3/uL (ref 1.0–3.6)
Lymphocytes Relative: 15 %
MCH: 29.5 pg (ref 26.0–34.0)
MCHC: 34.4 g/dL (ref 32.0–36.0)
MCV: 85.9 fL (ref 80.0–100.0)
MONO ABS: 0.4 10*3/uL (ref 0.2–1.0)
MONOS PCT: 6 %
NEUTROS PCT: 77 %
Neutro Abs: 5.7 10*3/uL (ref 1.4–6.5)
Platelets: 198 10*3/uL (ref 150–440)
RBC: 5.02 MIL/uL (ref 4.40–5.90)
RDW: 13.1 % (ref 11.5–14.5)
WBC: 7.4 10*3/uL (ref 3.8–10.6)

## 2016-11-30 MED ORDER — KETOROLAC TROMETHAMINE 30 MG/ML IJ SOLN
INTRAMUSCULAR | Status: AC
  Filled 2016-11-30: qty 1

## 2016-11-30 MED ORDER — KETOROLAC TROMETHAMINE 30 MG/ML IJ SOLN
30.0000 mg | Freq: Once | INTRAMUSCULAR | Status: AC
  Administered 2016-11-30: 30 mg via INTRAVENOUS

## 2016-11-30 MED ORDER — ONDANSETRON HCL 4 MG PO TABS: 4.0000 mg | ORAL_TABLET | Freq: Every day | ORAL | 0 refills | Status: DC | PRN

## 2016-11-30 MED ORDER — SODIUM CHLORIDE 0.9 % IV BOLUS (SEPSIS)
500.0000 mL | Freq: Once | INTRAVENOUS | Status: AC
  Administered 2016-11-30: 500 mL via INTRAVENOUS

## 2016-11-30 MED ORDER — IBUPROFEN 200 MG PO TABS: 600.0000 mg | ORAL_TABLET | Freq: Four times a day (QID) | ORAL | 0 refills | Status: DC | PRN

## 2016-11-30 NOTE — Discharge Instructions (Signed)
Please return to the emergency room for any new or worrisome symptoms. We do noticed that you have calcifications in her prostate, which apparently have been there for some time. Our  strong suspicion today is that you  passed a kidney stone. If he have fever, vomiting, increased pain refill worse in any way return to the emergency department. Follow closely with primary care and urology.

## 2016-11-30 NOTE — ED Provider Notes (Addendum)
[facility] Emergency Department Provider Note  ____________________________________________   I have reviewed the triage vital signs and the nursing notes.   HISTORY  Chief Complaint Flank Pain    HPI Elijah Logan is a 36 y.o. male w/ hx of spinal bifida occulta with no neurologic complaints and chronic back pain from disc issues, also had a kidney stone when he was 38. None since. Woke up this am at 2:45 am with flank pain. Got better with a warm bath but came back.  Sharp and colicky. Nothing else about and nothing makes it worse no radiation no dysuria no urinary frequency, no hematuria. Felt fine before going to bed. No numbness no weakness no focal neurologic symptoms, no incontinence of bowel or bladder. No recent trauma. It did happen after he rolled over. He does have chronic back pain but this feels more like his kidney stone.   Past Medical History:  Diagnosis Date  . Allergic rhinitis due to pollen    mild  . Bulging lumbar disc   . Chronic low back pain    Has spina bifida occulta  . Heart palpitations    PVCs noted on Event Monitor - some in [place] or couplets.  . History of kidney stones   . Hyperlipidemia   . Moderate obesity   . OSA on CPAP     Patient Active Problem List   Diagnosis Date Noted  . Acute cervical sprain 12/25/2015  . IBS (irritable bowel syndrome) 12/19/2015  . PVC's (premature ventricular contractions) 12/07/2013  . Palpitations 11/28/2013  . Severe obesity (BMI >= 40) ([place]) 10/22/2013  . Routine general medical examination at a health care facility 09/29/2012  . OSA (obstructive sleep apnea) 09/29/2012  . Chronic low back pain   . Allergic rhinitis due to pollen     Past Surgical History:  Procedure Laterality Date  . OPEN ANTERIOR SHOULDER RECONSTRUCTION  1998   Right  . TRANSTHORACIC ECHOCARDIOGRAM  12/28/2013   EF 55-60%.  Normal diastolic function  . TREADMILL STRESS ECHO  01/02/2014   Ex 10:31 Min; 12.5  METS --> no EKG changes, Normal pre & post Echo Wall Motion. -- No ischemia or infarction    Prior to Admission medications   Medication Sig Start Date End Date Taking? Authorizing Provider  cyclobenzaprine (FLEXERIL) 10 MG tablet Take 0.5-1 tablets (5-10 mg total) by mouth 3 times/day as needed-between meals & bedtime for muscle spasms. 12/25/15   Venia Carbon, MD  hyoscyamine (LEVSIN SL) 0.125 MG SL tablet Place 1 tablet (0.125 mg total) under the tongue every 4 (four) hours as needed. 12/19/15   Venia Carbon, MD    Allergies Patient has no known allergies.  Family History  Problem Relation Age of Onset  . Diabetes Mother        borderline  . Diabetes Father        borderline  . Heart disease Father        MI and needed defibrillation; PCI x3  . Heart attack Father   . Hypertension Other   . Stroke Other   . Cancer Other        prostate cancer  . Diabetes Maternal Grandmother   . Diabetes Paternal Grandmother     Social History Social History  Substance Use Topics  . Smoking status: Never Smoker  . Smokeless tobacco: Never Used  . Alcohol use Yes     Comment: 0-1week    Review of Systems Constitutional: No fever/chills  Eyes: No visual changes. ENT: No sore throat. No stiff neck no neck pain Cardiovascular: Denies chest pain. Respiratory: Denies shortness of breath. Gastrointestinal:   no vomiting.  No diarrhea.  No constipation. Genitourinary: Negative for dysuria. Musculoskeletal: Negative lower extremity swelling Skin: Negative for rash. Neurological: Negative for severe headaches, focal weakness or numbness.   ____________________________________________   PHYSICAL EXAM:  VITAL SIGNS: ED Triage Vitals  Enc Vitals Group     BP 11/30/16 0740 (!) 136/91     Pulse Rate 11/30/16 0740 72     Resp 11/30/16 0740 18     Temp 11/30/16 0740 98.4 F (36.9 C)     Temp src --      SpO2 11/30/16 0740 95 %     Weight 11/30/16 0740 (!) 333 lb (151 kg)      Height --      Head Circumference --      Peak Flow --      Pain Score 11/30/16 0739 3     Pain Loc --      Pain Edu? --      Excl. in [place]? --     Constitutional: Alert and oriented. Well appearing and in no acute distress. Eyes: Conjunctivae are normal Head: Atraumatic HEENT: No congestion/rhinnorhea. Mucous membranes are moist.  Oropharynx non-erythematous Neck:   Nontender with no meningismus, no masses, no stridor Cardiovascular: Normal rate, regular rhythm. Grossly normal heart sounds.  Good peripheral circulation. Respiratory: Normal respiratory effort.  No retractions. Lungs CTAB. Abdominal: Soft and nontender. No distention. No guarding no rebound Back:  There is no focal tenderness or step off.  there is no midline tenderness there are no lesions noted. there is slight left CVA tenderness GU: Normal external male genitalia no tenderness, no masses, no lesions noted Musculoskeletal: No lower extremity tenderness, no upper extremity tenderness. No joint effusions, no DVT signs strong distal pulses no edema Neurologic:  Normal speech and language. No gross focal neurologic deficits are appreciated.  Skin:  Skin is warm, dry and intact. No rash noted. Psychiatric: Mood and affect are normal. Speech and behavior are normal.  ____________________________________________   LABS (all labs ordered are listed, but only abnormal results are displayed)  Labs Reviewed  CBC WITH DIFFERENTIAL/PLATELET  BASIC METABOLIC PANEL  URINALYSIS, COMPLETE (UACMP) WITH MICROSCOPIC   ____________________________________________  EKG  I personally interpreted any EKGs ordered by me or triage  ____________________________________________  RADIOLOGY  I reviewed any imaging ordered by me or triage that were performed during my shift and, if possible, patient and/or family made aware of any abnormal findings. ____________________________________________   PROCEDURES  Procedure(s)  performed: None  Procedures  Critical Care performed: None  ____________________________________________   INITIAL IMPRESSION / ASSESSMENT AND PLAN / ED COURSE  Pertinent labs & imaging results that were available during my care of the patient were reviewed by me and considered in my medical decision making (see chart for details).  Patient here with back pain which sounds musculoskeletal or kidney stone most likely based on history. No evidence of cauda equina syndrome, shingles, intra-abdominal referred pain such as AAA, etc. We'll check urine, because it has been so long since he was CT scan unfortunately suffers from morbid obesity, we will obtain CT scan to rule out other pathology. Patient in no acute distress at this time. We have treated with nonsteroidal pain medications and reassess  ----------------------------------------- 9:16 AM on 11/30/2016 -----------------------------------------  Patient pain-free, no symptoms or complaints. He does have calcifications  in his prostate which I did inform him of any states he was already aware of that from when he was 10. We will send a urine culture low suspicion however for infection. Given hematuria history of kidney stones and CT findings most likely this is a passed stone. Patient may were all findings on CT scan and the need to follow-up. We'll refer him to primary care as well as urology.    ____________________________________________   FINAL CLINICAL IMPRESSION(S) / ED DIAGNOSES  Final diagnoses:  None      This chart was dictated using voice recognition software.  Despite best efforts to proofread,  errors can occur which can change meaning.      Schuyler Amor, MD 11/30/16 [id number]    Schuyler Amor, MD 11/30/16 [phone]

## 2016-11-30 NOTE — ED Triage Notes (Signed)
Pt with left side flank/back pain started last night. Pt with hx of kidney stones. Pt with urge to urinate but has not gone much since last night.

## 2016-11-30 NOTE — ED Notes (Signed)
AAOx3.  Skin warm and dry.  NAD 

## 2016-12-01 ENCOUNTER — Ambulatory Visit (INDEPENDENT_AMBULATORY_CARE_PROVIDER_SITE_OTHER): Payer: BLUE CROSS/BLUE SHIELD | Admitting: Urology

## 2016-12-01 ENCOUNTER — Encounter: Payer: Self-pay | Admitting: Urology

## 2016-12-01 VITALS — BP 133/82 | HR 67 | Ht 70.0 in | Wt 332.8 lb

## 2016-12-01 DIAGNOSIS — N132 Hydronephrosis with renal and ureteral calculous obstruction: Secondary | ICD-10-CM

## 2016-12-01 DIAGNOSIS — R3129 Other microscopic hematuria: Secondary | ICD-10-CM

## 2016-12-01 DIAGNOSIS — R161 Splenomegaly, not elsewhere classified: Secondary | ICD-10-CM | POA: Diagnosis not present

## 2016-12-01 DIAGNOSIS — R109 Unspecified abdominal pain: Secondary | ICD-10-CM | POA: Diagnosis not present

## 2016-12-01 LAB — URINE CULTURE: CULTURE: NO GROWTH

## 2016-12-01 LAB — URINALYSIS, COMPLETE
Bilirubin, UA: NEGATIVE
GLUCOSE, UA: NEGATIVE
KETONES UA: NEGATIVE
LEUKOCYTES UA: NEGATIVE
NITRITE UA: NEGATIVE
Protein, UA: NEGATIVE
SPEC GRAV UA: 1.02 (ref 1.005–1.030)
Urobilinogen, Ur: 0.2 mg/dL (ref 0.2–1.0)
pH, UA: 5.5 (ref 5.0–7.5)

## 2016-12-01 LAB — BLADDER SCAN AMB NON-IMAGING: Scan Result: 12

## 2016-12-01 NOTE — Progress Notes (Signed)
12/01/2016 10:55 AM   Jerene Pitch 1981-03-07 [id number]  Referring provider: Venia Carbon, MD [address] [place], [place] [address]  Chief Complaint  Patient presents with  . New Patient (Initial Visit)    Flank pain referred by ER    HPI: Patient is a 36 year old WM who is referred by the [facility] ED for possible passage of stone with his wife, Olegario Shearer.    He states that was awaken with flank pain yesterday.  He took a warm bath and the pain abated, but it then returned a few hours later.  He described the pain as sharp and colicky.    He sought treatment in the ED.  His CBC and BMP were normal.  His RBC's were TNTC on UA.  Urine culture was negative. CT Renal stone study was positive for Slight fullness of the left renal collecting system without renal or ureteral calculus evident. Question recent calculus passage on the left. Early pyelonephritis could present in this manner. No evidence suggesting renal abscess. No perinephric stranding noted.  Hepatic steatosis without focal liver lesion on this non contrast enhanced study.  Splenic enlargement without focal splenic lesion evident.  Occasional left colonic diverticular without diverticulitis. No bowel obstruction or abscess. Appendix appears normal.  Multiple prostatic calculi noted.  Small ventral hernia containing only fat.  Today, he is not having any pain.  He has not had any further flank pain.  He denies any fevers, chills, nausea and vomiting.  He did not capture a fragment.  His UA was unremarkable.  PMH: Past Medical History:  Diagnosis Date  . Allergic rhinitis due to pollen    mild  . Bulging lumbar disc   . Chronic low back pain    Has spina bifida occulta  . Heart palpitations    PVCs noted on Event Monitor - some in Bigeminy or couplets.  . History of kidney stones   . Hyperlipidemia   . Moderate obesity   . OSA on CPAP     Surgical History: Past Surgical History:  Procedure Laterality Date    . OPEN ANTERIOR SHOULDER RECONSTRUCTION  1998   Right  . TRANSTHORACIC ECHOCARDIOGRAM  12/28/2013   EF 55-60%.  Normal diastolic function  . TREADMILL STRESS ECHO  01/02/2014   Ex 10:31 Min; 12.5 METS --> no EKG changes, Normal pre & post Echo Wall Motion. -- No ischemia or infarction    Home Medications:  Allergies as of 12/01/2016   No Known Allergies     Medication List       Accurate as of 12/01/16 10:55 AM. Always use your most recent med list.          cyclobenzaprine 10 MG tablet Commonly known as:  FLEXERIL Take 0.5-1 tablets (5-10 mg total) by mouth 3 times/day as needed-between meals & bedtime for muscle spasms.   fluticasone 50 MCG/ACT nasal spray Commonly known as:  FLONASE Place 2 sprays into both nostrils daily.   hyoscyamine 0.125 MG SL tablet Commonly known as:  LEVSIN SL Place 1 tablet (0.125 mg total) under the tongue every 4 (four) hours as needed.   ibuprofen 200 MG tablet Commonly known as:  MOTRIN IB Take 3 tablets (600 mg total) by mouth every 6 (six) hours as needed.   loratadine 10 MG tablet Commonly known as:  CLARITIN Take 10 mg by mouth daily.   ondansetron 4 MG tablet Commonly known as:  ZOFRAN Take 1 tablet (4 mg  total) by mouth daily as needed for nausea or vomiting.       Allergies:  No Known Allergies  Family History: Family History  Problem Relation Age of Onset  . Diabetes Mother        borderline  . Diabetes Father        borderline  . Heart disease Father        MI and needed defibrillation; PCI x3  . Heart attack Father   . Hypertension Other   . Stroke Other   . Cancer Other        prostate cancer  . Diabetes Maternal Grandmother   . Diabetes Paternal Grandmother   . Prostate cancer Other   . Kidney cancer Neg Hx   . Bladder Cancer Neg Hx     Social History:  reports that he has never smoked. He has never used smokeless tobacco. He reports that he drinks alcohol. He reports that he does not use  drugs.  ROS: UROLOGY Frequent Urination?: No Hard to postpone urination?: Yes Burning/pain with urination?: Yes Get up at night to urinate?: No Leakage of urine?: No Urine stream starts and stops?: No Trouble starting stream?: No Do you have to strain to urinate?: No Blood in urine?: No Urinary tract infection?: No Sexually transmitted disease?: No Injury to kidneys or bladder?: No Painful intercourse?: No Weak stream?: No Erection problems?: No Penile pain?: No  Gastrointestinal Nausea?: Yes Vomiting?: No Indigestion/heartburn?: No Diarrhea?: No Constipation?: No  Constitutional Fever: No Night sweats?: No Weight loss?: No Fatigue?: No  Skin Skin rash/lesions?: No Itching?: No  Eyes Blurred vision?: Yes Double vision?: No  Ears/Nose/Throat Sore throat?: No Sinus problems?: No  Hematologic/Lymphatic Swollen glands?: No Easy bruising?: No  Cardiovascular Leg swelling?: No Chest pain?: No  Respiratory Cough?: No Shortness of breath?: No  Endocrine Excessive thirst?: No  Musculoskeletal Back pain?: Yes Joint pain?: Yes  Neurological Headaches?: No Dizziness?: No  Psychologic Depression?: No Anxiety?: Yes  Physical Exam: BP 133/82   Pulse 67   Ht 5\' 10"  (1.778 m)   Wt (!) 332 lb 12.8 oz (151 kg)   BMI 47.75 kg/m   Constitutional: Well nourished. Alert and oriented, No acute distress. HEENT: Ball AT, moist mucus membranes. Trachea midline, no masses. Cardiovascular: No clubbing, cyanosis, or edema. Respiratory: Normal respiratory effort, no increased work of breathing. GI: Abdomen is soft, non tender, non distended, no abdominal masses. Liver and spleen not palpable.  No hernias appreciated.  Stool sample for occult testing is not indicated.   GU: No CVA tenderness.  No bladder fullness or masses.  Patient with buried penis.  Urethral meatus is patent.  No penile discharge. No penile lesions or rashes. Scrotum without lesions, cysts,  rashes and/or edema.  Testicles are located scrotally bilaterally. No masses are appreciated in the testicles. Left and right epididymis are normal. Rectal: Patient with  normal sphincter tone. Anus and perineum without scarring or rashes. No rectal masses are appreciated. Prostate is approximately 35 grams, no nodules are appreciated. Seminal vesicles are normal. Skin: No rashes, bruises or suspicious lesions. Lymph: No cervical or inguinal adenopathy. Neurologic: Grossly intact, no focal deficits, moving all 4 extremities. Psychiatric: Normal mood and affect.  Laboratory Data: Lab Results  Component Value Date   WBC 7.4 11/30/2016   HGB 14.8 11/30/2016   HCT 43.1 11/30/2016   MCV 85.9 11/30/2016   PLT 198 11/30/2016    Lab Results  Component Value Date   CREATININE 1.23 11/30/2016  Urinalysis Unremarkable.  See EPIC.    I have independently reviewed the labs.   Pertinent Imaging: CLINICAL DATA:  Flank pain on left  EXAM: CT ABDOMEN AND PELVIS WITHOUT CONTRAST  TECHNIQUE: Multidetector CT imaging of the abdomen and pelvis was performed following the standard protocol without oral or intravenous contrast material administration.  COMPARISON:  None.  FINDINGS: Lower chest: Lung bases are clear.  Hepatobiliary: There is hepatic steatosis. No focal liver lesions are appreciable on this noncontrast enhanced study. Gallbladder wall is not appreciably thickened. There is no biliary duct dilatation.  Pancreas: No pancreatic mass or inflammatory focus evident.  Spleen: Spleen measures 16.8 x 12.5 x 6.2 cm with a measured splenic volume of 651 cubic cm. No focal splenic lesions are evident.  Adrenals/Urinary Tract: Adrenals appear unremarkable bilaterally. Kidneys bilaterally show no evidence of mass. There is minimal fullness of the left renal collecting system. No hydronephrosis on the right. There is no appreciable renal or ureteral calculus on either  side. No perinephric stranding noted. Urinary bladder midline without wall thickening.  Stomach/Bowel: There are occasional left-sided colonic diverticula without diverticulitis. No bowel wall or mesenteric thickening. No bowel obstruction. No free air or portal venous air.  Vascular/Lymphatic: No abdominal aortic aneurysm. No vascular lesions are evident. There is no appreciable adenopathy in the abdomen or pelvis.  Reproductive: There are multiple prostatic calculi. Prostate and seminal vesicles are normal in size and contour. No evident pelvic mass.  Other: Appendix appears unremarkable. No abscess or ascites is evident in the abdomen or pelvis. There is a small ventral hernia containing only fat.  Musculoskeletal: There are no evident blastic or lytic bone lesions. There is slight retrolisthesis of L5 on S1. There is spina bifida occulta at S1. No intramuscular or abdominal wall lesion.  IMPRESSION: 1. Slight fullness of the left renal collecting system without renal or ureteral calculus evident. Question recent calculus passage on the left. Early pyelonephritis could present in this manner. No evidence suggesting renal abscess. No perinephric stranding noted.  2. Hepatic steatosis without focal liver lesion on this noncontrast enhanced study.  3.  Splenic enlargement without focal splenic lesion evident.  4. Occasional left colonic diverticular without diverticulitis. No bowel obstruction or abscess. Appendix appears normal.  5.  Multiple prostatic calculi noted.  6.  Small ventral hernia containing only fat.   Electronically Signed   By: Lowella Grip III M.D.   On: 11/30/2016 08:33 I have independently reviewed the films.    Assessment & Plan:    1. Flank pain  - resolved  - likely a passage of stone  2. Left hydronephrosis  - obtain a RUS to ensure hydronephrosis has resolved  - RTC in one month  3. Microscopic hematuria  -  Urinalysis, Complete - no hematuria  4. Prostate calculus  - no family history of prostate cancer  - likely due to prior infections  4. Enlarged spleen  - seen on CT  - will report to Dr. Silvio Pate  Return in about 1 month (around 01/01/2017) for RUS report .  These notes generated with voice recognition software. I apologize for typographical errors.  Zara Council, [facility] [address] [place], [place] [address] [phone]

## 2016-12-06 ENCOUNTER — Telehealth: Payer: Self-pay

## 2016-12-06 ENCOUNTER — Encounter: Payer: Self-pay | Admitting: Internal Medicine

## 2016-12-06 ENCOUNTER — Ambulatory Visit (INDEPENDENT_AMBULATORY_CARE_PROVIDER_SITE_OTHER): Payer: BLUE CROSS/BLUE SHIELD | Admitting: Internal Medicine

## 2016-12-06 VITALS — BP 124/80 | HR 83 | Temp 98.5°F | Wt 335.0 lb

## 2016-12-06 DIAGNOSIS — R161 Splenomegaly, not elsewhere classified: Secondary | ICD-10-CM | POA: Insufficient documentation

## 2016-12-06 NOTE — Assessment & Plan Note (Signed)
No focal lesions Not enlarged on exam and no adenopathy CBC normal No fevers, etc Probably normal variant (he is a big guy) Reassured---no action unless he has symptoms

## 2016-12-06 NOTE — Progress Notes (Signed)
Subjective:    Patient ID: Elijah Logan, male    DOB: 06-28-1980, 36 y.o.   MRN: [id number]  HPI Here for ER follow up  Reviewed imaging done in ER CT scan report reviewed Severe pain while at home---went to hospital after they got someone to watch their kids Seen and sent home---stone apparently passed Went to urology--no action needed since Bladder scan okay there Sent here for the spleen abnormality  Current Outpatient Prescriptions on File Prior to Visit  Medication Sig Dispense Refill  . cyclobenzaprine (FLEXERIL) 10 MG tablet Take 0.5-1 tablets (5-10 mg total) by mouth 3 times/day as needed-between meals & bedtime for muscle spasms. 15 tablet 0  . fluticasone (FLONASE) 50 MCG/ACT nasal spray Place 2 sprays into both nostrils daily.    . hyoscyamine (LEVSIN SL) 0.125 MG SL tablet Place 1 tablet (0.125 mg total) under the tongue every 4 (four) hours as needed. 60 tablet 3  . ibuprofen (MOTRIN IB) 200 MG tablet Take 3 tablets (600 mg total) by mouth every 6 (six) hours as needed. 20 tablet 0  . loratadine (CLARITIN) 10 MG tablet Take 10 mg by mouth daily.     No current facility-administered medications on file prior to visit.     No Known Allergies  Past Medical History:  Diagnosis Date  . Allergic rhinitis due to pollen    mild  . Bulging lumbar disc   . Chronic low back pain    Has spina bifida occulta  . Heart palpitations    PVCs noted on Event Monitor - some in Bigeminy or couplets.  . History of kidney stones   . Hyperlipidemia   . Moderate obesity   . OSA on CPAP     Past Surgical History:  Procedure Laterality Date  . OPEN ANTERIOR SHOULDER RECONSTRUCTION  1998   Right  . TRANSTHORACIC ECHOCARDIOGRAM  12/28/2013   EF 55-60%.  Normal diastolic function  . TREADMILL STRESS ECHO  01/02/2014   Ex 10:31 Min; 12.5 METS --> no EKG changes, Normal pre & post Echo Wall Motion. -- No ischemia or infarction    Family History  Problem Relation Age of Onset  .  Diabetes Mother        borderline  . Diabetes Father        borderline  . Heart disease Father        MI and needed defibrillation; PCI x3  . Heart attack Father   . Hypertension Other   . Stroke Other   . Cancer Other        prostate cancer  . Diabetes Maternal Grandmother   . Diabetes Paternal Grandmother   . Prostate cancer Other   . Kidney cancer Neg Hx   . Bladder Cancer Neg Hx     Social History   Social History  . Marital status: Married    Spouse name: N/A  . Number of children: 2  . Years of education: N/A   Occupational History  . 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 Smoking status: Never Smoker  . Smokeless tobacco: Never Used  . Alcohol use Yes     Comment: 0-1week  . Drug use: No  . Sexual activity: Not on file   Other Topics Concern  . Not on file   Social History Narrative      He currently works for [employer] in  [place]. His wife is a Designer, jewellery at [employer] No fevers No unexpected weight loss No adenopathy     Objective:   Physical Exam  Cardiovascular: Normal rate, regular rhythm and normal heart sounds.  Exam reveals no gallop.   No murmur heard. Pulmonary/Chest: Effort normal and breath sounds normal. No respiratory distress. He has no wheezes. He has no rales.  Abdominal: Soft. Bowel sounds are normal. He exhibits no distension and no mass. There is no tenderness. There is no rebound and no guarding.  No HSM by palpation or percussion  Lymphadenopathy:    He has no cervical adenopathy.    He has no axillary adenopathy.       Right: No inguinal adenopathy present.       Left: No inguinal adenopathy present.          Assessment & Plan:

## 2016-12-06 NOTE — Telephone Encounter (Signed)
Spoke to pt about his recent ER Visit. He said they are pretty sure he passed the stone. He has been to urology and they said there was some inflammation in his kidneys and his spleen looks enlarged. He will follow-up with urology in about a month about the kidney, but he has an appt here today to f/u on the enlarged spleen.

## 2016-12-31 ENCOUNTER — Encounter: Payer: BLUE CROSS/BLUE SHIELD | Admitting: Internal Medicine

## 2017-01-04 ENCOUNTER — Other Ambulatory Visit: Payer: Self-pay | Admitting: Radiology

## 2017-01-04 DIAGNOSIS — N1330 Unspecified hydronephrosis: Secondary | ICD-10-CM

## 2017-01-06 ENCOUNTER — Ambulatory Visit: Payer: BLUE CROSS/BLUE SHIELD | Admitting: Urology

## 2017-01-06 ENCOUNTER — Encounter: Payer: Self-pay | Admitting: Urology

## 2017-01-06 DIAGNOSIS — G4733 Obstructive sleep apnea (adult) (pediatric): Secondary | ICD-10-CM | POA: Diagnosis not present

## 2017-01-10 DIAGNOSIS — Z0130 Encounter for examination of blood pressure without abnormal findings: Secondary | ICD-10-CM | POA: Diagnosis not present

## 2017-02-22 DIAGNOSIS — Z23 Encounter for immunization: Secondary | ICD-10-CM | POA: Diagnosis not present

## 2017-03-16 ENCOUNTER — Ambulatory Visit (INDEPENDENT_AMBULATORY_CARE_PROVIDER_SITE_OTHER): Payer: BLUE CROSS/BLUE SHIELD | Admitting: Internal Medicine

## 2017-03-16 ENCOUNTER — Encounter: Payer: Self-pay | Admitting: Internal Medicine

## 2017-03-16 VITALS — BP 120/88 | HR 76 | Temp 97.8°F | Ht 70.0 in | Wt 328.0 lb

## 2017-03-16 DIAGNOSIS — Z0000 Encounter for general adult medical examination without abnormal findings: Secondary | ICD-10-CM

## 2017-03-16 DIAGNOSIS — K589 Irritable bowel syndrome without diarrhea: Secondary | ICD-10-CM | POA: Diagnosis not present

## 2017-03-16 LAB — CBC
HCT: 45.7 % (ref 39.0–52.0)
Hemoglobin: 15.3 g/dL (ref 13.0–17.0)
MCHC: 33.5 g/dL (ref 30.0–36.0)
MCV: 88.4 fl (ref 78.0–100.0)
Platelets: 210 10*3/uL (ref 150.0–400.0)
RBC: 5.17 Mil/uL (ref 4.22–5.81)
RDW: 13.4 % (ref 11.5–15.5)
WBC: 6.2 10*3/uL (ref 4.0–10.5)

## 2017-03-16 LAB — COMPREHENSIVE METABOLIC PANEL
ALBUMIN: 4.4 g/dL (ref 3.5–5.2)
ALT: 27 U/L (ref 0–53)
AST: 18 U/L (ref 0–37)
Alkaline Phosphatase: 50 U/L (ref 39–117)
BUN: 11 mg/dL (ref 6–23)
CHLORIDE: 104 meq/L (ref 96–112)
CO2: 30 meq/L (ref 19–32)
CREATININE: 0.97 mg/dL (ref 0.40–1.50)
Calcium: 9.6 mg/dL (ref 8.4–10.5)
GFR: 93.09 mL/min (ref >60.00)
GLUCOSE: 101 mg/dL — AB (ref 70–99)
Potassium: 4.4 mEq/L (ref 3.5–5.1)
SODIUM: 140 meq/L (ref 135–145)
Total Bilirubin: 0.5 mg/dL (ref 0.2–1.2)
Total Protein: 7.3 g/dL (ref 6.0–8.3)

## 2017-03-16 LAB — LIPID PANEL
CHOL/HDL RATIO: 5
CHOLESTEROL: 172 mg/dL (ref 0–200)
HDL: 34 mg/dL — ABNORMAL LOW (ref >39.00)
LDL CALC: 109 mg/dL — AB (ref 0–99)
NONHDL: 137.74
Triglycerides: 143 mg/dL (ref 0.0–149.0)
VLDL: 28.6 mg/dL (ref 0.0–40.0)

## 2017-03-16 MED ORDER — HYOSCYAMINE SULFATE 0.125 MG SL SUBL: 0.1250 mg | SUBLINGUAL_TABLET | SUBLINGUAL | 3 refills | Status: DC | PRN

## 2017-03-16 NOTE — Assessment & Plan Note (Signed)
Better lately Still uses hyoscyamine prn

## 2017-03-16 NOTE — Assessment & Plan Note (Signed)
Discussed fitness Had flu vaccine Healthy though Needs to work with the stress and set work limits

## 2017-03-16 NOTE — Patient Instructions (Signed)
DASH Eating Plan DASH stands for "Dietary Approaches to Stop Hypertension." The DASH eating plan is a healthy eating plan that has been shown to reduce high blood pressure (hypertension). It may also reduce your risk for type 2 diabetes, heart disease, and stroke. The DASH eating plan may also help with weight loss. What are tips for following this plan? General guidelines  Avoid eating more than 2,300 mg (milligrams) of salt (sodium) a day. If you have hypertension, you may need to reduce your sodium intake to 1,500 mg a day.  Limit alcohol intake to no more than 1 drink a day for nonpregnant women and 2 drinks a day for men. One drink equals 12 oz of beer, 5 oz of wine, or 1 oz of hard liquor.  Work with your health care provider to maintain a healthy body weight or to lose weight. Ask what an ideal weight is for you.  Get at least 30 minutes of exercise that causes your heart to beat faster (aerobic exercise) most days of the week. Activities may include walking, swimming, or biking.  Work with your health care provider or diet and nutrition specialist (dietitian) to adjust your eating plan to your individual calorie needs. Reading food labels  Check food labels for the amount of sodium per serving. Choose foods with less than 5 percent of the Daily Value of sodium. Generally, foods with less than 300 mg of sodium per serving fit into this eating plan.  To find whole grains, look for the word "whole" as the first word in the ingredient list. Shopping  Buy products labeled as "low-sodium" or "no salt added."  Buy fresh foods. Avoid canned foods and premade or frozen meals. Cooking  Avoid adding salt when cooking. Use salt-free seasonings or herbs instead of table salt or sea salt. Check with your health care provider or pharmacist before using salt substitutes.  Do not fry foods. Cook foods using healthy methods such as baking, boiling, grilling, and broiling instead.  Cook with  heart-healthy oils, such as olive, canola, soybean, or sunflower oil. Meal planning   Eat a balanced diet that includes: ? 5 or more servings of fruits and vegetables each day. At each meal, try to fill half of your plate with fruits and vegetables. ? Up to 6-8 servings of whole grains each day. ? Less than 6 oz of lean meat, poultry, or fish each day. A 3-oz serving of meat is about the same size as a deck of cards. One egg equals 1 oz. ? 2 servings of low-fat dairy each day. ? A serving of nuts, seeds, or beans 5 times each week. ? Heart-healthy fats. Healthy fats called Omega-3 fatty acids are found in foods such as flaxseeds and coldwater fish, like sardines, salmon, and mackerel.  Limit how much you eat of the following: ? Canned or prepackaged foods. ? Food that is high in trans fat, such as fried foods. ? Food that is high in saturated fat, such as fatty meat. ? Sweets, desserts, sugary drinks, and other foods with added sugar. ? Full-fat dairy products.  Do not salt foods before eating.  Try to eat at least 2 vegetarian meals each week.  Eat more home-cooked food and less restaurant, buffet, and fast food.  When eating at a restaurant, ask that your food be prepared with less salt or no salt, if possible. What foods are recommended? The items listed may not be a complete list. Talk with your dietitian about what   dietary choices are best for you. Grains Whole-grain or whole-wheat bread. Whole-grain or whole-wheat pasta. Brown rice. Oatmeal. Quinoa. Bulgur. Whole-grain and low-sodium cereals. Pita bread. Low-fat, low-sodium crackers. Whole-wheat flour tortillas. Vegetables Fresh or frozen vegetables (raw, steamed, roasted, or grilled). Low-sodium or reduced-sodium tomato and vegetable juice. Low-sodium or reduced-sodium tomato sauce and tomato paste. Low-sodium or reduced-sodium canned vegetables. Fruits All fresh, dried, or frozen fruit. Canned fruit in natural juice (without  added sugar). Meat and other protein foods Skinless chicken or turkey. Ground chicken or turkey. Pork with fat trimmed off. Fish and seafood. Egg whites. Dried beans, peas, or lentils. Unsalted nuts, nut butters, and seeds. Unsalted canned beans. Lean cuts of beef with fat trimmed off. Low-sodium, lean deli meat. Dairy Low-fat (1%) or fat-free (skim) milk. Fat-free, low-fat, or reduced-fat cheeses. Nonfat, low-sodium ricotta or cottage cheese. Low-fat or nonfat yogurt. Low-fat, low-sodium cheese. Fats and oils Soft margarine without trans fats. Vegetable oil. Low-fat, reduced-fat, or light mayonnaise and salad dressings (reduced-sodium). Canola, safflower, olive, soybean, and sunflower oils. Avocado. Seasoning and other foods Herbs. Spices. Seasoning mixes without salt. Unsalted popcorn and pretzels. Fat-free sweets. What foods are not recommended? The items listed may not be a complete list. Talk with your dietitian about what dietary choices are best for you. Grains Baked goods made with fat, such as croissants, muffins, or some breads. Dry pasta or rice meal packs. Vegetables Creamed or fried vegetables. Vegetables in a cheese sauce. Regular canned vegetables (not low-sodium or reduced-sodium). Regular canned tomato sauce and paste (not low-sodium or reduced-sodium). Regular tomato and vegetable juice (not low-sodium or reduced-sodium). Pickles. Olives. Fruits Canned fruit in a light or heavy syrup. Fried fruit. Fruit in cream or butter sauce. Meat and other protein foods Fatty cuts of meat. Ribs. Fried meat. Bacon. Sausage. Bologna and other processed lunch meats. Salami. Fatback. Hotdogs. Bratwurst. Salted nuts and seeds. Canned beans with added salt. Canned or smoked fish. Whole eggs or egg yolks. Chicken or turkey with skin. Dairy Whole or 2% milk, cream, and half-and-half. Whole or full-fat cream cheese. Whole-fat or sweetened yogurt. Full-fat cheese. Nondairy creamers. Whipped toppings.  Processed cheese and cheese spreads. Fats and oils Butter. Stick margarine. Lard. Shortening. Ghee. Bacon fat. Tropical oils, such as coconut, palm kernel, or palm oil. Seasoning and other foods Salted popcorn and pretzels. Onion salt, garlic salt, seasoned salt, table salt, and sea salt. Worcestershire sauce. Tartar sauce. Barbecue sauce. Teriyaki sauce. Soy sauce, including reduced-sodium. Steak sauce. Canned and packaged gravies. Fish sauce. Oyster sauce. Cocktail sauce. Horseradish that you find on the shelf. Ketchup. Mustard. Meat flavorings and tenderizers. Bouillon cubes. Hot sauce and Tabasco sauce. Premade or packaged marinades. Premade or packaged taco seasonings. Relishes. Regular salad dressings. Where to find more information:  National Heart, Lung, and Blood Institute: www.nhlbi.nih.gov  American Heart Association: www.heart.org Summary  The DASH eating plan is a healthy eating plan that has been shown to reduce high blood pressure (hypertension). It may also reduce your risk for type 2 diabetes, heart disease, and stroke.  With the DASH eating plan, you should limit salt (sodium) intake to 2,300 mg a day. If you have hypertension, you may need to reduce your sodium intake to 1,500 mg a day.  When on the DASH eating plan, aim to eat more fresh fruits and vegetables, whole grains, lean proteins, low-fat dairy, and heart-healthy fats.  Work with your health care provider or diet and nutrition specialist (dietitian) to adjust your eating plan to your individual   calorie needs. This information is not intended to replace advice given to you by your health care provider. Make sure you discuss any questions you have with your health care provider. Document Released: 04/08/2011 Document Revised: 04/12/2016 Document Reviewed: 04/12/2016 Elsevier Interactive Patient Education  2017 [employer]vier Inc.  

## 2017-03-16 NOTE — Assessment & Plan Note (Signed)
DASH info Recheck lipid

## 2017-03-16 NOTE — Progress Notes (Signed)
Subjective:    Patient ID: Elijah Logan, male    DOB: Apr 12, 1981, 36 y.o.   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 cyclobenzaprine (FLEXERIL) 10 MG tablet Take 0.5-1 tablets (5-10 mg total) by mouth 3 times/day as needed-between meals & bedtime for muscle spasms. 15 tablet 0  . fluticasone (FLONASE) 50 MCG/ACT nasal spray Place 2 sprays into both nostrils daily.    . hyoscyamine (LEVSIN SL) 0.125 MG SL tablet Place 1 tablet (0.125 mg total) under the tongue every 4 (four) hours as needed. 60 tablet 3  . ibuprofen (MOTRIN IB) 200 MG tablet Take 3 tablets (600 mg total) by mouth every 6 (six) hours as needed. 20 tablet 0  . loratadine (CLARITIN) 10 MG tablet Take 10 mg by mouth daily.     No current facility-administered medications on file prior to visit.     No Known Allergies  Past Medical History:  Diagnosis Date  . Allergic rhinitis due to pollen    mild  . Bulging lumbar disc   . Chronic low back pain    Has spina bifida occulta  . Heart palpitations    PVCs noted on Event Monitor - some in [place] or couplets.  . History of kidney stones   . Hyperlipidemia   . Moderate obesity   . OSA on CPAP     Past Surgical History:  Procedure Laterality Date  . OPEN ANTERIOR SHOULDER RECONSTRUCTION  1998   Right  . TRANSTHORACIC ECHOCARDIOGRAM  12/28/2013   EF 55-60%.  Normal diastolic function  . TREADMILL STRESS ECHO  01/02/2014   Ex 10:31 Min; 12.5 METS --> no EKG changes,  Normal pre & post Echo Wall Motion. -- No ischemia or infarction    Family History  Problem Relation Age of Onset  . Diabetes Mother        borderline  . Diabetes Father        borderline  . Heart disease Father        MI and needed defibrillation; PCI x3  . Heart attack Father   . Hypertension Other   . Stroke Other   . Cancer Other        prostate cancer  . Diabetes Maternal Grandmother   . Diabetes Paternal Grandmother   . Prostate cancer Other   . Kidney cancer Neg Hx   . Bladder Cancer Neg Hx     Social History   Socioeconomic History  . Marital status: Married    Spouse name: Not on file  . Number of children: 2  . Years of education: Not on file  . Highest education level: Not on file  Social Needs  . Financial resource strain: Not on file  . Food insecurity - worry: Not on file  . Food insecurity - inability: Not on file  . Transportation needs - medical: Not on file  . Transportation needs - non-medical: Not on file  Occupational History  . 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 Smoking status: Never Smoker  . Smokeless tobacco: Never Used  Substance and Sexual Activity  . Alcohol use: Yes    Comment: 0-1week  . Drug use: No  . Sexual activity: Not on file  Other Topics Concern  . Not on file  Social History Narrative      He currently works for [employer] in [place]. His wife is a Designer, jewellery at [employer]  Constitutional:       Weight down 5# in 1 year Wears seat belt  HENT: Negative for dental problem, hearing loss and trouble swallowing.        Rare right ear tinnitus Keeps up with dentist  Eyes: Negative for visual disturbance.       Occ blurry vision in the afternoon---strain with computer  Respiratory: Negative for chest tightness and shortness of breath.        Cough with recent cold  Cardiovascular: Negative for chest  pain, palpitations and leg swelling.  Gastrointestinal: Negative for blood in stool.       No heartburn  Endocrine: Negative for polydipsia and polyuria.  Genitourinary: Negative for difficulty urinating and urgency.       No sexual problems  Musculoskeletal: Positive for back pain. Negative for arthralgias and joint swelling.  Skin: Negative for rash.       Saw derm last year for mole follow up Has spot on left neck to be checked  Allergic/Immunologic: Positive for environmental allergies. Negative for immunocompromised state.       Using loratadine and flonase daily  Neurological: Negative for dizziness, syncope and light-headedness.       Tension headaches  Hematological: Negative for adenopathy. Does not bruise/bleed easily.  Psychiatric/Behavioral:       Sleeps okay with CPAP--some trouble with mask fit Stress with some mood issues--all secondary to work. No set anxiety  Wife suggesting he go for [place] counseling       Objective:   Physical Exam  Constitutional: He is oriented to person, place, and time. No distress.  HENT:  Head: Normocephalic and atraumatic.  Right Ear: External ear normal.  Left Ear: External ear normal.  Mouth/Throat: Oropharynx is clear and moist. No oropharyngeal exudate.  Eyes: Conjunctivae are normal. Pupils are equal, round, and reactive to light.  Neck: No thyromegaly present.  Cardiovascular: Normal rate, regular rhythm, normal heart sounds and intact distal pulses. Exam reveals no gallop.  No murmur heard. Pulmonary/Chest: Effort normal and breath sounds normal. No respiratory distress. He has no wheezes. He has no rales.  Abdominal: Soft. He exhibits no distension. There is no tenderness. There is no rebound and no guarding.  Musculoskeletal: He exhibits no edema or tenderness.  Lymphadenopathy:    He has no cervical adenopathy.  Neurological: He is alert and oriented to person, place, and time.  Skin:  Benign keratosis on  neck Multiple benign nevi  Psychiatric: He has a normal mood and affect. His behavior is normal.          Assessment & Plan:

## 2017-06-27 DIAGNOSIS — G4733 Obstructive sleep apnea (adult) (pediatric): Secondary | ICD-10-CM | POA: Diagnosis not present

## 2017-06-28 DIAGNOSIS — R918 Other nonspecific abnormal finding of lung field: Secondary | ICD-10-CM | POA: Diagnosis not present

## 2017-06-28 DIAGNOSIS — I517 Cardiomegaly: Secondary | ICD-10-CM | POA: Diagnosis not present

## 2017-06-28 DIAGNOSIS — I4510 Unspecified right bundle-branch block: Secondary | ICD-10-CM | POA: Diagnosis not present

## 2017-06-28 DIAGNOSIS — R072 Precordial pain: Secondary | ICD-10-CM | POA: Diagnosis not present

## 2017-06-28 DIAGNOSIS — R079 Chest pain, unspecified: Secondary | ICD-10-CM | POA: Diagnosis not present

## 2017-06-28 DIAGNOSIS — R0789 Other chest pain: Secondary | ICD-10-CM | POA: Diagnosis not present

## 2017-06-30 ENCOUNTER — Encounter: Payer: Self-pay | Admitting: Internal Medicine

## 2017-06-30 ENCOUNTER — Ambulatory Visit: Payer: BLUE CROSS/BLUE SHIELD | Admitting: Internal Medicine

## 2017-06-30 VITALS — BP 136/88 | HR 84 | Temp 97.7°F | Wt 334.5 lb

## 2017-06-30 DIAGNOSIS — R079 Chest pain, unspecified: Secondary | ICD-10-CM

## 2017-06-30 NOTE — Progress Notes (Signed)
Subjective:    Patient ID: Elijah Logan, male    DOB: Mar 09, 1981, 37 y.o.   MRN: [id number]  HPI Here for follow up of ER visit for chest pain  Awoke 2 mornings ago Dull pressure in upper left chest Went to work--figured it was heartburn Worsened and got some shooting type pains (but not too bad) Skipped 9AM meeting and went to clinic at work--sent to ER  Had EKG and 2 negative enzymes Sent home Briefly more intense while he was there Finally tapered off --- got GI cocktail and he released some gas (burping) It seemed to help  No regular problems with heartburn or reflux Did have immense chest pressure a few weeks ago after drinking some wine while preparing dinner Took tums and something else---then got big burp and got instant relief  No recurrence since the ER visit Taking OTC acid reducer (?ranitidine)---once in evenings Eating a lot late at night  Current Outpatient Medications on File Prior to Visit  Medication Sig Dispense Refill  . cyclobenzaprine (FLEXERIL) 10 MG tablet Take 0.5-1 tablets (5-10 mg total) by mouth 3 times/day as needed-between meals & bedtime for muscle spasms. 15 tablet 0  . fluticasone (FLONASE) 50 MCG/ACT nasal spray Place 2 sprays into both nostrils daily.    . hyoscyamine (LEVSIN SL) 0.125 MG SL tablet Place 1 tablet (0.125 mg total) every 4 (four) hours as needed under the tongue. 60 tablet 3  . ibuprofen (MOTRIN IB) 200 MG tablet Take 3 tablets (600 mg total) by mouth every 6 (six) hours as needed. 20 tablet 0  . loratadine (CLARITIN) 10 MG tablet Take 10 mg by mouth daily.    Marland Kitchen omeprazole (PRILOSEC) 20 MG capsule Take 20 mg by mouth daily.     No current facility-administered medications on file prior to visit.     No Known Allergies  Past Medical History:  Diagnosis Date  . Allergic rhinitis due to pollen    mild  . Bulging lumbar disc   . Chronic low back pain    Has spina bifida occulta  . Heart palpitations    PVCs noted on Event  Monitor - some in [place] or couplets.  . History of kidney stones   . Hyperlipidemia   . Moderate obesity   . OSA on CPAP     Past Surgical History:  Procedure Laterality Date  . OPEN ANTERIOR SHOULDER RECONSTRUCTION  1998   Right  . TRANSTHORACIC ECHOCARDIOGRAM  12/28/2013   EF 55-60%.  Normal diastolic function  . TREADMILL STRESS ECHO  01/02/2014   Ex 10:31 Min; 12.5 METS --> no EKG changes, Normal pre & post Echo Wall Motion. -- No ischemia or infarction    Family History  Problem Relation Age of Onset  . Diabetes Mother        borderline  . Diabetes Father        borderline  . Heart disease Father        MI and needed defibrillation; PCI x3  . Heart attack Father   . Hypertension Other   . Stroke Other   . Cancer Other        prostate cancer  . Diabetes Maternal Grandmother   . Diabetes Paternal Grandmother   . Prostate cancer Other   . Kidney cancer Neg Hx   . Bladder Cancer Neg Hx     Social History   Socioeconomic History  . Marital status: Married    Spouse name: Not on file  .  Number of children: 2  . Years of education: Not on file  . Highest education level: Not on file  Social Needs  . Financial resource strain: Not on file  . Food insecurity - worry: Not on file  . Food insecurity - inability: Not on file  . Transportation needs - medical: Not on file  . Transportation needs - non-medical: Not on file  Occupational History  . 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 Smoking status: Never Smoker  . Smokeless tobacco: Never Used  Substance and Sexual Activity  . Alcohol use: Yes    Comment: 0-1week  . Drug use: No  . Sexual activity: Not on file  Other Topics Concern  . Not on file  Social History Narrative      He currently works for [employer] in [place]. 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  Neck: No thyromegaly present.  Cardiovascular: Normal rate and normal heart sounds. Exam reveals no gallop.  No murmur heard. Pulmonary/Chest: Effort normal and breath sounds normal. No respiratory distress. He has no wheezes. He has no rales. He exhibits no tenderness.  Abdominal: Soft. He exhibits no distension. There is no tenderness. There is no rebound and no guarding.  Lymphadenopathy:    He has no cervical adenopathy.          Assessment & Plan:

## 2017-06-30 NOTE — Assessment & Plan Note (Addendum)
Negative work up in ER Mostly likely esophagitis----poor eating habits, late at night Likely acid damage at night Discussed healthier eating habits Take omeprazole 20mg  daily for 2-4 weeks Can then switch to ranitidine 150mg  bid Discussed elevating bed  Is at increased cardiovascular risk Discussed prevention

## 2017-06-30 NOTE — Patient Instructions (Signed)
Please take omeprazole 20mg  daily on an empty stomach for 2-4 weeks. Try not to eat close to bedtime and consider elevating your head in bed. If you are no longer having any symptoms, you can try changing to ranitidine 150mg  twice a day.

## 2017-07-05 ENCOUNTER — Other Ambulatory Visit: Payer: Self-pay | Admitting: Internal Medicine

## 2017-07-05 NOTE — Telephone Encounter (Signed)
Pt seen for OV with Dr. Silvio Pate on 2/28 and was told to take Omeprazole per documentation during OV.  Medication not prescribed by Dr. Silvio Pate previously and is noted on current medication list as prescribed by historical provider.

## 2017-07-05 NOTE — Telephone Encounter (Signed)
Copied from [place]. Topic: Quick Communication - See Telephone Encounter >> Jul 05, 2017  9:13 AM Hewitt Shorts wrote: CRM for notification. See Telephone encounter for: pt was seen on 06/30/17 and the medication for his prilosec was not sent over to the pharmacy  [facility] [address] [address]  07/05/17.

## 2017-07-06 ENCOUNTER — Encounter: Payer: Self-pay | Admitting: Internal Medicine

## 2017-07-06 MED ORDER — OMEPRAZOLE 20 MG PO CPDR: 20.0000 mg | DELAYED_RELEASE_CAPSULE | Freq: Every day | ORAL | 3 refills | Status: DC

## 2017-08-23 DIAGNOSIS — M25571 Pain in right ankle and joints of right foot: Secondary | ICD-10-CM | POA: Diagnosis not present

## 2017-08-23 DIAGNOSIS — M7661 Achilles tendinitis, right leg: Secondary | ICD-10-CM | POA: Diagnosis not present

## 2017-08-31 DIAGNOSIS — M79671 Pain in right foot: Secondary | ICD-10-CM | POA: Diagnosis not present

## 2017-08-31 DIAGNOSIS — M7661 Achilles tendinitis, right leg: Secondary | ICD-10-CM | POA: Diagnosis not present

## 2017-09-01 DIAGNOSIS — M79671 Pain in right foot: Secondary | ICD-10-CM | POA: Diagnosis not present

## 2017-09-01 DIAGNOSIS — M7661 Achilles tendinitis, right leg: Secondary | ICD-10-CM | POA: Diagnosis not present

## 2017-09-04 DIAGNOSIS — M25511 Pain in right shoulder: Secondary | ICD-10-CM | POA: Diagnosis not present

## 2017-09-06 DIAGNOSIS — M25511 Pain in right shoulder: Secondary | ICD-10-CM | POA: Diagnosis not present

## 2017-09-06 DIAGNOSIS — M25571 Pain in right ankle and joints of right foot: Secondary | ICD-10-CM | POA: Diagnosis not present

## 2017-09-08 ENCOUNTER — Telehealth: Payer: Self-pay | Admitting: Internal Medicine

## 2017-09-08 ENCOUNTER — Encounter: Payer: Self-pay | Admitting: Internal Medicine

## 2017-09-08 NOTE — Telephone Encounter (Signed)
3 attempts to schedule fu appt from recall list.   Deleting recall.  °Mailed Letter  °

## 2017-09-09 ENCOUNTER — Ambulatory Visit: Payer: BLUE CROSS/BLUE SHIELD | Attending: Orthopaedic Surgery

## 2017-09-09 ENCOUNTER — Other Ambulatory Visit: Payer: Self-pay

## 2017-09-09 DIAGNOSIS — R269 Unspecified abnormalities of gait and mobility: Secondary | ICD-10-CM | POA: Insufficient documentation

## 2017-09-09 DIAGNOSIS — M25511 Pain in right shoulder: Secondary | ICD-10-CM | POA: Insufficient documentation

## 2017-09-09 DIAGNOSIS — M79672 Pain in left foot: Secondary | ICD-10-CM | POA: Insufficient documentation

## 2017-09-09 DIAGNOSIS — M79671 Pain in right foot: Secondary | ICD-10-CM | POA: Insufficient documentation

## 2017-09-09 NOTE — Patient Instructions (Signed)
Access Code: [email or web address]  URL: [email or web address]  Date: 09/09/2017  Prepared by: Janna Arch   Exercises  Seated Ankle Alphabet - 1 reps - 3 sets - 1x daily - 7x weekly  Seated Scapular Retraction - 10 reps - 2 sets - 5 hold - 1x daily - 7x weekly  Standing Isometric Shoulder Internal Rotation at Doorway - 10 reps - 1 sets - 5 hold - 1x daily - 7x weekly  Isometric Shoulder Flexion with Ball at [place] 10 reps - 1 sets - 5 hold - 1x daily - 7x weekly  Isometric Shoulder External Rotation with Ball at [place] 10 reps - 1 sets - 5 hold - 1x daily - 7x weekly  Isometric Shoulder Extension with Ball at [place] 10 reps - 1 sets - 5 hold - 1x daily - 7x weekly  Isometric Shoulder Abduction with Ball at [place] 10 reps - 1 sets - 5 hold - 1x daily - 7x weekly

## 2017-09-09 NOTE — Therapy (Signed)
[place] MAIN [facility] SERVICES [address] [place], [place], [address] Phone: [phone]   Fax:  [phone]  Physical Therapy Evaluation  Patient Details  Name: Elijah Logan MRN: [id number] Date of Birth: [birth year] Referring Provider: Raliegh Ip, Dr Griffin Basil   Encounter Date: 09/09/2017  PT End of Session - 09/09/17 1024    Visit Number  1    Number of Visits  8    Date for PT Re-Evaluation  10/07/17    Authorization Type  1/10    PT Start Time  0904    PT Stop Time  0959    PT Time Calculation (min)  55 min    Equipment Utilized During Treatment  -- R sling, R immobilizer    Activity Tolerance  Patient tolerated treatment well;Patient limited by fatigue;Treatment limited secondary to medical complications (Comment)    Behavior During Therapy  [facility] for tasks assessed/performed       Past Medical History:  Diagnosis Date  . Allergic rhinitis due to pollen    mild  . Bulging lumbar disc   . Chronic low back pain    Has spina bifida occulta  . Heart palpitations    PVCs noted on Event Monitor - some in [place] or couplets.  . History of kidney stones   . Hyperlipidemia   . Moderate obesity   . OSA on CPAP     Past Surgical History:  Procedure Laterality Date  . OPEN ANTERIOR SHOULDER RECONSTRUCTION  1998   Right  . TRANSTHORACIC ECHOCARDIOGRAM  12/28/2013   EF 55-60%.  Normal diastolic function  . TREADMILL STRESS ECHO  01/02/2014   Ex 10:31 Min; 12.5 METS --> no EKG changes, Normal pre & post Echo Wall Motion. -- No ischemia or infarction    There were no vitals filed for this visit.   Subjective Assessment - 09/09/17 1020    Subjective  Patient is a pleasant 37 year old male who presents with R shoulder and R achilles pain.     Pertinent History  Saturday of easter weekend was at a campground, felt a pop in his heel, didn't hurt but within an hour couldn't walk on it. Walked on it until Tuesday and had to go in and was  found to be a partially torn achilles at insertion point and left foot is a partial tendintis/fasciatis from compensation. Started wearing boot on R foot and plantar fasciitis got painful to where he couldn't walk on L foot.  R foot started swelling, got prednisone and went to foot specialist. Was told to continue boot until May 31st. Sunday moving around in bed had arm above head and shoulder came out and subloxation that occurred for 2-3  minutews with patient having to put it into position that it came out. Had X ray, requested post op notes from [place] January 2008. Patient drives every day hour and ten minutes to primary office and hour twenty/thirty to [facility] office. Drives automatic, coaches soccer at night, bowed out of baseball due to not wanting to stand for prolonged periods of time.     Limitations  Sitting;Reading;Lifting;Standing;Walking;Writing;House hold activities;Other (comment)    How long can you sit comfortably?  5 minutes    How long can you stand comfortably?  5 minutes    How long can you walk comfortably?  5 minutes    Diagnostic tests  X ray    Patient Stated Goals  to reduce pain and return to PLOF  Currently in Pain?  Yes    Pain Score  3     Pain Location  Foot    Pain Orientation  Right;Left    Pain Descriptors / Indicators  Aching    Pain Type  Acute pain    Pain Onset  1 to 4 weeks ago    Pain Frequency  Intermittent    Aggravating Factors   standing    Effect of Pain on Daily Activities  limits    Multiple Pain Sites  Yes    Pain Score  1    Pain Location  Shoulder    Pain Orientation  Right    Pain Descriptors / Indicators  Aching;Burning    Pain Type  Acute pain    Pain Onset  In the past 7 days    Pain Frequency  Intermittent    Aggravating Factors   moving limb    Pain Relieving Factors  rest       PAIN:] At rest: 0/10 pain At worst : 6/10 in R shoulder At worst: 4/10 in R foot, 4-5 in L foot   QuickDash: 63.6% LEFS=33/80      Access Code: [email or web address]  URL: [email or web address]  Date: 09/09/2017  Prepared by: Janna Arch   Exercises  Seated Ankle Alphabet - 1 reps - 3 sets - 1x daily - 7x weekly  Seated Scapular Retraction - 10 reps - 2 sets - 5 hold - 1x daily - 7x weekly  Standing Isometric Shoulder Internal Rotation at Door[place] - 10 reps - 1 sets - 5 hold - 1x daily - 7x weekly  Isometric Shoulder Flexion with Ball at [place] 10 reps - 1 sets - 5 hold - 1x daily - 7x weekly  Isometric Shoulder External Rotation with Ball at [place] 10 reps - 1 sets - 5 hold - 1x daily - 7x weekly  Isometric Shoulder Extension with Ball at [employer] - 10 reps - 1 sets - 5 hold - 1x daily - 7x weekly  Isometric Shoulder Abduction with Ball at [employer] - 10 reps - 1 sets - 5 hold - 1x daily - 7x weekly   [facility] PT Assessment - 09/09/17 0001      Assessment   Medical Diagnosis  R shoulder subluxation, L and R achilles/foot    Referring Provider  Raliegh Ip, Dr Griffin Basil    Onset Date/Surgical Date  08/20/17    Hand Dominance  Right    Next MD Visit  21st    Prior Therapy  yes for R shoulder      Precautions   Precautions  Shoulder    Type of Shoulder Precautions  no lifting overhead, no hinging no putting in compromised position.     Shoulder Interventions  Shoulder sling/immobilizer;For comfort    Precaution Comments  boot in R foot, cleared ankle ROM     Required Braces or Orthoses  -- boot      Restrictions   Weight Bearing Restrictions  Yes    RLE Weight Bearing  -- boot       Balance Screen   Has the patient fallen in the past 6 months  Yes    How many times?  2    Has the patient had a decrease in activity level because of a fear of falling?   Yes    Is the patient reluctant to leave their home because of a fear of falling?   Yes      Home Environment  Living Environment  Private residence    Living Arrangements  Spouse/significant other;Children    Available Help at Discharge  Family    Type of [place] to enter    Entrance Stairs-Number of Steps  3    Entrance Stairs-Rails  Right    Home Layout  Two level    Alternate Level Stairs-Number of Steps  one flight    Alternate Level Stairs-Rails  Left    Welcome      Prior Function   Level of Independence  Independent    Vocation  Full time employment    Probation officer, drives long distances    Leisure  golf, fish      Cognition   Overall Cognitive Status  Within Functional Limits for tasks assessed      Observation/Other Assessments   Observations  wears shape ups on L foot, boot on R foot, sling on R shoulder, forward head rounded shoulders posture     Focus on Therapeutic Outcomes (FOTO)   LEFS, QuickDash      Observation/Other Assessments-Edema    Edema  -- noted in bilateral ankles due to weight bearing      Sensation   Light Touch  Impaired by gross assessment R shoulder hypersensitive,       Coordination   Gross Motor Movements are Fluid and Coordinated  No    Coordination and Movement Description  limited by fear of pain and limited strength/mobility      Posture/Postural Control   Posture/Postural Control  Postural limitations    Postural Limitations  Rounded Shoulders;Forward head      ROM / Strength   AROM / PROM / Strength  AROM;Strength      AROM   AROM Assessment Site  Shoulder;Ankle    Right/Left Shoulder  Right;Left    Right Shoulder Flexion  89 Degrees    Right Shoulder ABduction  50 Degrees    Right Shoulder External Rotation  8 Degrees    Left Shoulder Flexion  168 Degrees    Left Shoulder ABduction  170 Degrees    Left Shoulder External Rotation  55 Degrees    Right/Left Ankle  Right;Left    Right Ankle Dorsiflexion  3    Right Ankle Plantar Flexion  35    Left Ankle Dorsiflexion  9    Left Ankle Plantar Flexion  38      Strength   Overall Strength  Unable to assess;Due to pain;Deficits    Strength Assessment Site   Shoulder;Elbow;Hip;Knee;Ankle    Right/Left Shoulder  Right;Left    Right Shoulder Flexion  -- unable to assess    Right Shoulder Extension  -- unable to assess    Right Shoulder ABduction  -- unable to assess    Right Shoulder Internal Rotation  -- unable to assess    Right Shoulder External Rotation  -- unable to assess    Left Shoulder Flexion  4+/5    Left Shoulder Extension  4+/5    Left Shoulder ABduction  4+/5    Left Shoulder Internal Rotation  4+/5    Left Shoulder External Rotation  4+/5    Right/Left Elbow  Right;Left    Right Elbow Flexion  3-/5    Right Elbow Extension  3-/5    Left Elbow Flexion  4+/5    Left Elbow Extension  4+/5    Right/Left Hip  Right;Left  Right Hip Flexion  4-/5    Right Hip Extension  4-/5    Right Hip ABduction  4/5    Right Hip ADduction  4-/5    Left Hip Flexion  4+/5    Left Hip Extension  4-/5    Left Hip ABduction  4+/5    Left Hip ADduction  4/5    Right/Left Knee  Right;Left    Right Knee Flexion  4-/5    Right Knee Extension  4-/5    Left Knee Flexion  4/5    Left Knee Extension  4/5    Right/Left Ankle  Right;Left    Right Ankle Dorsiflexion  -- unable to assess    Right Ankle Plantar Flexion  -- unable to assess    Left Ankle Dorsiflexion  3/5    Left Ankle Plantar Flexion  3/5      Flexibility   Soft Tissue Assessment /Muscle Length  yes    Hamstrings  limited    Quadriceps  limited illiopsoas      Palpation   Palpation comment  MTPs in R and L foot hypomobile due to swelling, talocrural joint hypomobile due to swelling and guarding.       Transfers   Transfers  Sit to Stand;Stand to Sit    Sit to Stand  6: Modified independent (Device/Increase time)    Stand to Sit  6: Modified independent (Device/Increase time)      Ambulation/Gait   Ambulation/Gait  Yes    Ambulation/Gait Assistance  6: Modified independent (Device/Increase time)    Ambulation Distance (Feet)  30 Feet    Assistive device  None    Gait  Pattern  Step-through pattern;Decreased arm swing - right;Decreased stride length;Decreased weight shift to right;Antalgic;Poor foot clearance - right    Ambulation Surface  Level;Indoor            Objective measurements completed on examination: See above findings.              PT Education - 09/09/17 1024    Education provided  Yes    Education Details  POC, HEP, posture     Person(s) Educated  Patient    Methods  Explanation;Demonstration;Tactile cues;Verbal cues;Handout    Comprehension  Verbalized understanding;Returned demonstration       PT Short Term Goals - 09/09/17 1145      PT SHORT TERM GOAL #1   Title  Patient will be independent in home exercise program to improve strength/mobility for better functional independence with ADLs.    Baseline  HEP given    Time  2    Period  Weeks    Status  New    Target Date  09/23/17      PT SHORT TERM GOAL #2   Title  Patient will report a worst pain of 3/10 on VAS in ankle/feet to improve tolerance with ADLs and reduced symptoms with activities.     Baseline  4/10     Time  2    Period  Weeks    Status  New    Target Date  09/23/17      PT SHORT TERM GOAL #3   Title  Patient will report a worst pain of 4/10 on VAS in R shoulder to improve tolerance with ADLs and reduced symptoms with activities.     Baseline  6/10 R shoulder    Time  2    Period  Weeks    Status  New  Target Date  09/23/17      PT SHORT TERM GOAL #4   Title  Patient will improve R UE strength to at least 3/5 demonstrating improved ability to perform functional range of motion for ADL completion    Baseline  unable to lift arm due to fear of subluxation     Time  2    Period  Weeks    Status  New    Target Date  09/23/17      PT SHORT TERM GOAL #5   Title  Patient will ambulate without boot or AD <100 ft with equal weight bearing upon R and LLE for return to PLOF     Baseline  use boot and shape up for ambulation    Time  2     Period  Weeks    Status  New    Target Date  09/23/17        PT Long Term Goals - 09/09/17 1150      PT LONG TERM GOAL #1   Title  Patient will report a worst pain of 1/10 on VAS in feet/ankles  to improve tolerance with ADLs and reduced symptoms with activities.     Baseline  4/10 pain    Time  4    Period  Weeks    Status  New    Target Date  10/07/17      PT LONG TERM GOAL #2   Title  Patient will demonstrate adequate shoulder ROM and strength to be able to shave and dress independently with pain less than 3/10.    Baseline  6/10 pain in R shoulder , unable to lift     Time  4    Period  Weeks    Status  New    Target Date  10/07/17      PT LONG TERM GOAL #3   Title  Patient will increase lower extremity functional scale to >50/80 to demonstrate improved functional mobility and increased tolerance with ADLs.     Baseline  33/80    Time  4    Period  Weeks    Status  New    Target Date  10/07/17      PT LONG TERM GOAL #4   Title  Patient will return to golf game to allow for return to PLOF>     Baseline  unable to golf at this time    Time  4    Period  Weeks    Status  New    Target Date  10/07/17      PT LONG TERM GOAL #5   Title  Patient will decrease Quick DASH score by > 8 points ( <57%) demonstrating reduced self-reported upper extremity disability.    Baseline  5/10: 63.6%    Time  4    Period  Weeks    Status  New    Target Date  10/07/17             Plan - 09/09/17 1025    Clinical Impression Statement  Patient is a pleasant 37 year old male who presents with R shoulder pain s/p subluxation and R achilles pain. Patient will have surgery however is not sure of the date on R shoulder. Patient presents with proactive fear of pain response with tightening of musculature creating pain/discomfort prior to movement. Unable to assess joint movement of R glenohumeral joint due to guarding and recent subluxation. Introduced wall isometrics to decrease  subluxation frequency. R and L MTP joints hypomobile due to swelling in feet bilaterally. Decreased ROM actively noted in R foot with ABC's given in meantime until full clearance from boot allowed. At this time patient would benefit from focus on both portions of body until surgery date is known. Patient will benefit from skilled physical therapy for reduction of pain , improved mobility, and return to PLOF.     History and Personal Factors relevant to plan of care:  This patient presents with 3, personal factors/ comorbidities  and 4  body elements including body structures and functions, activity limitations and or participation restrictions. Patient's condition is unstable    Clinical Presentation  Unstable    Clinical Presentation due to:  R shoulder continues to subluxate, will have surgery in upcoming future, L and R foot pain    Clinical Decision Making  High    Rehab Potential  Fair    Clinical Impairments Affecting Rehab Potential  (+) age,motivation (-) multiple comorditities, hx of shoulder repair.     PT Frequency  2x / week    PT Duration  4 weeks    PT Treatment/Interventions  ADLs/Self Care Home Management;Cryotherapy;Electrical Stimulation;Moist Heat;Iontophoresis 4mg /ml Dexamethasone;Ultrasound;Therapeutic exercise;Therapeutic activities;Functional mobility training;Stair training;Gait training;DME Instruction;Balance training;Neuromuscular re-education;Patient/family education;Orthotic Fit/Training;Compression bandaging;Manual techniques;Passive range of motion;Dry needling;Energy conservation;Splinting;Taping    PT Next Visit Plan  review HEP, stabilize R shoulder, strech and mobilize ankles     PT Home Exercise Plan  see sheet    Consulted and Agree with Plan of Care  Patient       Patient will benefit from skilled therapeutic intervention in order to improve the following deficits and impairments:  Abnormal gait, Decreased activity tolerance, Decreased balance, Decreased  endurance, Decreased mobility, Decreased range of motion, Difficulty walking, Decreased strength, Hypomobility, Increased edema, Impaired flexibility, Impaired perceived functional ability, Impaired sensation, Impaired UE functional use, Postural dysfunction, Improper body mechanics, Pain  Visit Diagnosis: Acute pain of right shoulder  Pain in left foot  Pain in right foot  Abnormality of gait and mobility     Problem List Patient Active Problem List   Diagnosis Date Noted  . Chest pain 06/30/2017  . Spleen enlarged 12/06/2016  . Acute cervical sprain 12/25/2015  . IBS (irritable bowel syndrome) 12/19/2015  . PVC's (premature ventricular contractions) 12/07/2013  . Palpitations 11/28/2013  . Obesity, morbid, BMI 40.0-49.9 ([place]) 10/22/2013  . Routine general medical examination at a health care facility 09/29/2012  . OSA (obstructive sleep apnea) 09/29/2012  . Chronic low back pain   . Allergic rhinitis due to pollen    Janna Arch, PT, DPT   09/09/2017, 11:54 AM  [place] MAIN [facility] SERVICES [address] [place], [place], [address] Phone: [phone]   Fax:  [phone]  Name: Aidynn Krenn MRN: [id number] Date of Birth: Jan 23, 1981

## 2017-09-12 ENCOUNTER — Ambulatory Visit: Payer: BLUE CROSS/BLUE SHIELD

## 2017-09-12 DIAGNOSIS — M79671 Pain in right foot: Secondary | ICD-10-CM | POA: Diagnosis not present

## 2017-09-12 DIAGNOSIS — M25511 Pain in right shoulder: Secondary | ICD-10-CM | POA: Diagnosis not present

## 2017-09-12 DIAGNOSIS — M79672 Pain in left foot: Secondary | ICD-10-CM | POA: Diagnosis not present

## 2017-09-12 DIAGNOSIS — R269 Unspecified abnormalities of gait and mobility: Secondary | ICD-10-CM | POA: Diagnosis not present

## 2017-09-12 NOTE — Therapy (Signed)
[place] MAIN [facility] SERVICES [address] [place], [place], [address] Phone: [phone]   Fax:  [phone]  Physical Therapy Treatment  Patient Details  Name: Elijah Logan MRN: [id number] Date of Birth: 15-Sep-1980 Referring Provider: Raliegh Ip, Dr Griffin Basil   Encounter Date: 09/12/2017  PT End of Session - 09/12/17 0825    Visit Number  2    Number of Visits  8    Date for PT Re-Evaluation  10/07/17    Authorization Type  2/10    PT Start Time  0801    PT Stop Time  0845    PT Time Calculation (min)  44 min    Equipment Utilized During Treatment  -- R sling, R immobilizer    Activity Tolerance  Patient tolerated treatment well;Patient limited by fatigue;Treatment limited secondary to medical complications (Comment)    Behavior During Therapy  [facility] for tasks assessed/performed       Past Medical History:  Diagnosis Date  . Allergic rhinitis due to pollen    mild  . Bulging lumbar disc   . Chronic low back pain    Has spina bifida occulta  . Heart palpitations    PVCs noted on Event Monitor - some in [place] or couplets.  . History of kidney stones   . Hyperlipidemia   . Moderate obesity   . OSA on CPAP     Past Surgical History:  Procedure Laterality Date  . OPEN ANTERIOR SHOULDER RECONSTRUCTION  1998   Right  . TRANSTHORACIC ECHOCARDIOGRAM  12/28/2013   EF 55-60%.  Normal diastolic function  . TREADMILL STRESS ECHO  01/02/2014   Ex 10:31 Min; 12.5 METS --> no EKG changes, Normal pre & post Echo Wall Motion. -- No ischemia or infarction    There were no vitals filed for this visit.  Subjective Assessment - 09/12/17 0804    Subjective  Patient reports compliance with HEP, reports no pain in foot. Reports more pain in the mornings than in the afternoon. Reports ABCs don't hurt but can feel the muscles being weak.     Pertinent History  Saturday of easter weekend was at a campground, felt a pop in his heel, didn't hurt but  within an hour couldn't walk on it. Walked on it until Tuesday and had to go in and was found to be a partially torn achilles at insertion point and left foot is a partial tendintis/fasciatis from compensation. Started wearing boot on R foot and plantar fasciitis got painful to where he couldn't walk on L foot.  R foot started swelling, got prednisone and went to foot specialist. Was told to continue boot until May 31st. Sunday moving around in bed had arm above head and shoulder came out and subloxation that occurred for 2-3  minutews with patient having to put it into position that it came out. Had X ray, requested post op notes from [place] January 2008. Patient drives every day hour and ten minutes to primary office and hour twenty/thirty to [facility] office. Drives automatic, coaches soccer at night, bowed out of baseball due to not wanting to stand for prolonged periods of time.     Limitations  Sitting;Reading;Lifting;Standing;Walking;Writing;House hold activities;Other (comment)    How long can you sit comfortably?  5 minutes    How long can you stand comfortably?  5 minutes    How long can you walk comfortably?  5 minutes    Diagnostic tests  X ray  Patient Stated Goals  to reduce pain and return to PLOF    Currently in Pain?  Yes    Pain Score  2     Pain Location  Shoulder    Pain Orientation  Right;Left    Pain Descriptors / Indicators  Aching    Pain Type  Acute pain    Pain Onset  1 to 4 weeks ago    Pain Frequency  Intermittent    Pain Onset  In the past 7 days       Review HEP  Seated Scapular Retraction - 10 reps - 2 sets - 5 hold - 1x daily - 7x weekly   Standing Isometric Shoulder Internal Rotation at Doorway - 10 reps - 1 sets - 5 hold - 1x daily - 7x weekly   Isometric Shoulder Flexion with Ball at [employer] - 10 reps - 1 sets - 5 hold - 1x daily - 7x weekly   Isometric Shoulder External Rotation with Ball at [employer] - 10 reps - 1 sets - 5 hold - 1x daily - 7x weekly    Isometric Shoulder Extension with Ball at [employer] - 10 reps - 1 sets - 5 hold - 1x daily - 7x weekly   Isometric Shoulder Abduction with Ball at Wall - 10 reps - 1 sets - 5 hold - 1x daily - 7x weekly  Wall pushups: 10x  R upper trap stretch seated 3x30 seconds.   Straight leg raise 10x each LE, BLE; 2 sets   LE rotation for low back stretch.   Sidelying Abduction 10x RLE 2 sets  Sidelying extension 10x RLE 2 sets  Seated gastroc/soleus RLE rollout 4 minutes : noted medial and lateral trigger points.  Seated adduction squeeze 10x 5 second holds ; 2 sets                         PT Education - 09/12/17 0824    Education provided  Yes    Education Details  HEP correction, body mechanics, posture     Person(s) Educated  Patient    Methods  Explanation;Demonstration;Verbal cues    Comprehension  Verbalized understanding;Returned demonstration       PT Short Term Goals - 09/09/17 1145      PT SHORT TERM GOAL #1   Title  Patient will be independent in home exercise program to improve strength/mobility for better functional independence with ADLs.    Baseline  HEP given    Time  2    Period  Weeks    Status  New    Target Date  09/23/17      PT SHORT TERM GOAL #2   Title  Patient will report a worst pain of 3/10 on VAS in ankle/feet to improve tolerance with ADLs and reduced symptoms with activities.     Baseline  4/10     Time  2    Period  Weeks    Status  New    Target Date  09/23/17      PT SHORT TERM GOAL #3   Title  Patient will report a worst pain of 4/10 on VAS in R shoulder to improve tolerance with ADLs and reduced symptoms with activities.     Baseline  6/10 R shoulder    Time  2    Period  Weeks    Status  New    Target Date  09/23/17      PT SHORT  TERM GOAL #4   Title  Patient will improve R UE strength to at least 3/5 demonstrating improved ability to perform functional range of motion for ADL completion    Baseline  unable to lift  arm due to fear of subluxation     Time  2    Period  Weeks    Status  New    Target Date  09/23/17      PT SHORT TERM GOAL #5   Title  Patient will ambulate without boot or AD <100 ft with equal weight bearing upon R and LLE for return to PLOF     Baseline  use boot and shape up for ambulation    Time  2    Period  Weeks    Status  New    Target Date  09/23/17        PT Long Term Goals - 09/09/17 1150      PT LONG TERM GOAL #1   Title  Patient will report a worst pain of 1/10 on VAS in feet/ankles  to improve tolerance with ADLs and reduced symptoms with activities.     Baseline  4/10 pain    Time  4    Period  Weeks    Status  New    Target Date  10/07/17      PT LONG TERM GOAL #2   Title  Patient will demonstrate adequate shoulder ROM and strength to be able to shave and dress independently with pain less than 3/10.    Baseline  6/10 pain in R shoulder , unable to lift     Time  4    Period  Weeks    Status  New    Target Date  10/07/17      PT LONG TERM GOAL #3   Title  Patient will increase lower extremity functional scale to >50/80 to demonstrate improved functional mobility and increased tolerance with ADLs.     Baseline  33/80    Time  4    Period  Weeks    Status  New    Target Date  10/07/17      PT LONG TERM GOAL #4   Title  Patient will return to golf game to allow for return to PLOF>     Baseline  unable to golf at this time    Time  4    Period  Weeks    Status  New    Target Date  10/07/17      PT LONG TERM GOAL #5   Title  Patient will decrease Quick DASH score by > 8 points ( <57%) demonstrating reduced self-reported upper extremity disability.    Baseline  5/10: 63.6%    Time  4    Period  Weeks    Status  New    Target Date  10/07/17            Plan - 09/12/17 0842    Clinical Impression Statement  Patient required verbal/tactile cueing for positioning of body during isometric HEP exercises however demonstrated good muscle  recruitment and activation. Education on placing pillow under R shoulder in sleeping to improve sleep quality. Noted weakness of RLE in gravity resisted and anti gravity positions withotu boot. Trigger points noted medially and laterally in R calf.  Patient will continue to benefit from skilled physical therapy to reduce papin, improve mobility, and return to PLOF.     Rehab Potential  Fair  Clinical Impairments Affecting Rehab Potential  (+) age,motivation (-) multiple comorditities, hx of shoulder repair.     PT Frequency  2x / week    PT Duration  4 weeks    PT Treatment/Interventions  ADLs/Self Care Home Management;Cryotherapy;Electrical Stimulation;[place] Heat;Iontophoresis 4mg /ml Dexamethasone;Ultrasound;Therapeutic exercise;Therapeutic activities;Functional mobility training;Stair training;Gait training;DME Instruction;Balance training;Neuromuscular re-education;Patient/family education;Orthotic Fit/Training;Compression bandaging;Manual techniques;Passive range of motion;Dry needling;Energy conservation;Splinting;Taping    PT Next Visit Plan  review HEP, stabilize R shoulder, strech and mobilize ankles     PT Home Exercise Plan  see sheet    Consulted and Agree with Plan of Care  Patient       Patient will benefit from skilled therapeutic intervention in order to improve the following deficits and impairments:  Abnormal gait, Decreased activity tolerance, Decreased balance, Decreased endurance, Decreased mobility, Decreased range of motion, Difficulty walking, Decreased strength, Hypomobility, Increased edema, Impaired flexibility, Impaired perceived functional ability, Impaired sensation, Impaired UE functional use, Postural dysfunction, Improper body mechanics, Pain  Visit Diagnosis: Acute pain of right shoulder  Pain in left foot  Pain in right foot  Abnormality of gait and mobility     Problem List Patient Active Problem List   Diagnosis Date Noted  . Chest pain 06/30/2017   . Spleen enlarged 12/06/2016  . Acute cervical sprain 12/25/2015  . IBS (irritable bowel syndrome) 12/19/2015  . PVC's (premature ventricular contractions) 12/07/2013  . Palpitations 11/28/2013  . Obesity, morbid, BMI 40.0-49.9 ([place]) 10/22/2013  . Routine general medical examination at a health care facility 09/29/2012  . OSA (obstructive sleep apnea) 09/29/2012  . Chronic low back pain   . Allergic rhinitis due to pollen    Janna Arch, PT, DPT   09/12/2017, 8:45 AM  [place] MAIN [facility] SERVICES [address] [place], [place], [address] Phone: [phone]   Fax:  [phone]  Name: Elijah Logan MRN: [id number] Date of Birth: Jul 29, 1980

## 2017-09-14 ENCOUNTER — Ambulatory Visit: Payer: BLUE CROSS/BLUE SHIELD

## 2017-09-14 DIAGNOSIS — M25511 Pain in right shoulder: Secondary | ICD-10-CM | POA: Diagnosis not present

## 2017-09-14 DIAGNOSIS — R269 Unspecified abnormalities of gait and mobility: Secondary | ICD-10-CM

## 2017-09-14 DIAGNOSIS — M79672 Pain in left foot: Secondary | ICD-10-CM | POA: Diagnosis not present

## 2017-09-14 DIAGNOSIS — M79671 Pain in right foot: Secondary | ICD-10-CM | POA: Diagnosis not present

## 2017-09-14 NOTE — Therapy (Signed)
[place] MAIN [facility] SERVICES [address] [place], [place], [address] Phone: [phone]   Fax:  [phone]  Physical Therapy Treatment  Patient Details  Name: Prynce Jacober MRN: [id number] Date of Birth: 06-22-1980 Referring Provider: Raliegh Ip, Dr Griffin Basil   Encounter Date: 09/14/2017  PT End of Session - 09/14/17 0845    Visit Number  3    Number of Visits  8    Date for PT Re-Evaluation  10/07/17    Authorization Type  3/10    PT Start Time  0800    PT Stop Time  0844    PT Time Calculation (min)  44 min    Equipment Utilized During Treatment  -- R sling, R immobilizer    Activity Tolerance  Patient tolerated treatment well;Patient limited by fatigue;Treatment limited secondary to medical complications (Comment)    Behavior During Therapy  [facility] for tasks assessed/performed       Past Medical History:  Diagnosis Date  . Allergic rhinitis due to pollen    mild  . Bulging lumbar disc   . Chronic low back pain    Has spina bifida occulta  . Heart palpitations    PVCs noted on Event Monitor - some in [place] or couplets.  . History of kidney stones   . Hyperlipidemia   . Moderate obesity   . OSA on CPAP     Past Surgical History:  Procedure Laterality Date  . OPEN ANTERIOR SHOULDER RECONSTRUCTION  1998   Right  . TRANSTHORACIC ECHOCARDIOGRAM  12/28/2013   EF 55-60%.  Normal diastolic function  . TREADMILL STRESS ECHO  01/02/2014   Ex 10:31 Min; 12.5 METS --> no EKG changes, Normal pre & post Echo Wall Motion. -- No ischemia or infarction    There were no vitals filed for this visit.  Subjective Assessment - 09/14/17 0803    Subjective  Patient drove yesterday extra and is feeling more pain this morning. Tried sleeping with a pillow under arm and that has helped his sleeping.     Pertinent History  Saturday of easter weekend was at a campground, felt a pop in his heel, didn't hurt but within an hour couldn't walk on it.  Walked on it until Tuesday and had to go in and was found to be a partially torn achilles at insertion point and left foot is a partial tendintis/fasciatis from compensation. Started wearing boot on R foot and plantar fasciitis got painful to where he couldn't walk on L foot.  R foot started swelling, got prednisone and went to foot specialist. Was told to continue boot until May 31st. Sunday moving around in bed had arm above head and shoulder came out and subloxation that occurred for 2-3  minutews with patient having to put it into position that it came out. Had X ray, requested post op notes from [place] January 2008. Patient drives every day hour and ten minutes to primary office and hour twenty/thirty to [facility] office. Drives automatic, coaches soccer at night, bowed out of baseball due to not wanting to stand for prolonged periods of time.     Limitations  Sitting;Reading;Lifting;Standing;Walking;Writing;House hold activities;Other (comment)    How long can you sit comfortably?  5 minutes    How long can you stand comfortably?  5 minutes    How long can you walk comfortably?  5 minutes    Diagnostic tests  X ray    Patient Stated Goals  to reduce  pain and return to PLOF    Currently in Pain?  Yes    Pain Score  5     Pain Location  Shoulder    Pain Orientation  Right    Pain Descriptors / Indicators  Aching    Pain Type  Acute pain    Pain Onset  1 to 4 weeks ago    Pain Frequency  Intermittent    Pain Onset  In the past 7 days       Supine isometrics: R shoulder  Abduction, adduction, ER, IR, Flexion, Extension 10x 5 second holds  Reverse scapular punches with support RUE 10x 6 second holds  PROM Flexion, abduction, modified IR and ER 10x10 seconds ; limited all ranges.   Ice cup massage 2 minutes R GH      Seated gastroc/soleus RLE rollout 4 minutes : noted medial and lateral trigger points.   Seated adduction squeeze 10x 5 second holds ; 2 sets  Pt. response to medical  necessity:  Patient will continue to benefit from skilled physical therapy to reduce pain, improve mobility, and return to PLOF.                        PT Education - 09/14/17 0844    Education provided  Yes    Education Details  ice massage, body mechanics, posture, manual     Person(s) Educated  Patient    Methods  Explanation;Demonstration;Verbal cues    Comprehension  Verbalized understanding;Returned demonstration       PT Short Term Goals - 09/09/17 1145      PT SHORT TERM GOAL #1   Title  Patient will be independent in home exercise program to improve strength/mobility for better functional independence with ADLs.    Baseline  HEP given    Time  2    Period  Weeks    Status  New    Target Date  09/23/17      PT SHORT TERM GOAL #2   Title  Patient will report a worst pain of 3/10 on VAS in ankle/feet to improve tolerance with ADLs and reduced symptoms with activities.     Baseline  4/10     Time  2    Period  Weeks    Status  New    Target Date  09/23/17      PT SHORT TERM GOAL #3   Title  Patient will report a worst pain of 4/10 on VAS in R shoulder to improve tolerance with ADLs and reduced symptoms with activities.     Baseline  6/10 R shoulder    Time  2    Period  Weeks    Status  New    Target Date  09/23/17      PT SHORT TERM GOAL #4   Title  Patient will improve R UE strength to at least 3/5 demonstrating improved ability to perform functional range of motion for ADL completion    Baseline  unable to lift arm due to fear of subluxation     Time  2    Period  Weeks    Status  New    Target Date  09/23/17      PT SHORT TERM GOAL #5   Title  Patient will ambulate without boot or AD <100 ft with equal weight bearing upon R and LLE for return to PLOF     Baseline  use boot and shape up  for ambulation    Time  2    Period  Weeks    Status  New    Target Date  09/23/17        PT Long Term Goals - 09/09/17 1150      PT LONG TERM  GOAL #1   Title  Patient will report a worst pain of 1/10 on VAS in feet/ankles  to improve tolerance with ADLs and reduced symptoms with activities.     Baseline  4/10 pain    Time  4    Period  Weeks    Status  New    Target Date  10/07/17      PT LONG TERM GOAL #2   Title  Patient will demonstrate adequate shoulder ROM and strength to be able to shave and dress independently with pain less than 3/10.    Baseline  6/10 pain in R shoulder , unable to lift     Time  4    Period  Weeks    Status  New    Target Date  10/07/17      PT LONG TERM GOAL #3   Title  Patient will increase lower extremity functional scale to >50/80 to demonstrate improved functional mobility and increased tolerance with ADLs.     Baseline  33/80    Time  4    Period  Weeks    Status  New    Target Date  10/07/17      PT LONG TERM GOAL #4   Title  Patient will return to golf game to allow for return to PLOF>     Baseline  unable to golf at this time    Time  4    Period  Weeks    Status  New    Target Date  10/07/17      PT LONG TERM GOAL #5   Title  Patient will decrease Quick DASH score by > 8 points ( <57%) demonstrating reduced self-reported upper extremity disability.    Baseline  5/10: 63.6%    Time  4    Period  Weeks    Status  New    Target Date  10/07/17            Plan - 09/14/17 0845    Clinical Impression Statement  Patient pain decreased after manual tx with decreased throbbing.  Passive ROM is limited by patient muscle guarding and pain at this time however improved range was created with repetitive motion at this time. Patient will continue to benefit from skilled physical therapy to reduce pain, improve mobility, and return to PLOF.     Rehab Potential  Fair    Clinical Impairments Affecting Rehab Potential  (+) age,motivation (-) multiple comorditities, hx of shoulder repair.     PT Frequency  2x / week    PT Duration  4 weeks    PT Treatment/Interventions  ADLs/Self Care  Home Management;Cryotherapy;Electrical Stimulation;Moist Heat;Iontophoresis 4mg /ml Dexamethasone;Ultrasound;Therapeutic exercise;Therapeutic activities;Functional mobility training;Stair training;Gait training;DME Instruction;Balance training;Neuromuscular re-education;Patient/family education;Orthotic Fit/Training;Compression bandaging;Manual techniques;Passive range of motion;Dry needling;Energy conservation;Splinting;Taping    PT Next Visit Plan  review HEP, stabilize R shoulder, strech and mobilize ankles     PT Home Exercise Plan  see sheet    Consulted and Agree with Plan of Care  Patient       Patient will benefit from skilled therapeutic intervention in order to improve the following deficits and impairments:  Abnormal gait, Decreased activity tolerance, Decreased balance,  Decreased endurance, Decreased mobility, Decreased range of motion, Difficulty walking, Decreased strength, Hypomobility, Increased edema, Impaired flexibility, Impaired perceived functional ability, Impaired sensation, Impaired UE functional use, Postural dysfunction, Improper body mechanics, Pain  Visit Diagnosis: Acute pain of right shoulder  Pain in left foot  Pain in right foot  Abnormality of gait and mobility     Problem List Patient Active Problem List   Diagnosis Date Noted  . Chest pain 06/30/2017  . Spleen enlarged 12/06/2016  . Acute cervical sprain 12/25/2015  . IBS (irritable bowel syndrome) 12/19/2015  . PVC's (premature ventricular contractions) 12/07/2013  . Palpitations 11/28/2013  . Obesity, morbid, BMI 40.0-49.9 ([place]) 10/22/2013  . Routine general medical examination at a health care facility 09/29/2012  . OSA (obstructive sleep apnea) 09/29/2012  . Chronic low back pain   . Allergic rhinitis due to pollen    Janna Arch, PT, DPT   09/14/2017, 8:46 AM  [place] MAIN [facility] SERVICES [address] [place], [place], [address] Phone:  [phone]   Fax:  [phone]  Name: Keaundre Thelin MRN: [id number] Date of Birth: 1981/02/19

## 2017-09-19 ENCOUNTER — Ambulatory Visit: Payer: BLUE CROSS/BLUE SHIELD | Admitting: Physical Therapy

## 2017-09-19 ENCOUNTER — Other Ambulatory Visit: Payer: Self-pay | Admitting: Orthopaedic Surgery

## 2017-09-19 ENCOUNTER — Encounter: Payer: Self-pay | Admitting: Physical Therapy

## 2017-09-19 DIAGNOSIS — M25511 Pain in right shoulder: Principal | ICD-10-CM

## 2017-09-19 DIAGNOSIS — R269 Unspecified abnormalities of gait and mobility: Secondary | ICD-10-CM | POA: Diagnosis not present

## 2017-09-19 DIAGNOSIS — M79672 Pain in left foot: Secondary | ICD-10-CM

## 2017-09-19 DIAGNOSIS — M2530 Other instability, unspecified joint: Secondary | ICD-10-CM

## 2017-09-19 DIAGNOSIS — G8929 Other chronic pain: Secondary | ICD-10-CM

## 2017-09-19 DIAGNOSIS — M79671 Pain in right foot: Secondary | ICD-10-CM | POA: Diagnosis not present

## 2017-09-19 NOTE — Therapy (Signed)
[place] MAIN [facility] SERVICES [address] [place], [place], [address] Phone: [phone]   Fax:  [phone]  Physical Therapy Treatment  Patient Details  Name: Elijah Logan MRN: [id number] Date of Birth: December 12, 1980 Referring Provider: Raliegh Ip, Dr Griffin Basil   Encounter Date: 09/19/2017  PT End of Session - 09/19/17 0805    Visit Number  4    Number of Visits  8    Date for PT Re-Evaluation  10/07/17    Authorization Type  4/10    PT Start Time  0803    PT Stop Time  0844    PT Time Calculation (min)  41 min    Equipment Utilized During Treatment  -- R sling, R immobilizer    Activity Tolerance  Patient tolerated treatment well;Patient limited by fatigue;Treatment limited secondary to medical complications (Comment)    Behavior During Therapy  [facility] for tasks assessed/performed       Past Medical History:  Diagnosis Date  . Allergic rhinitis due to pollen    mild  . Bulging lumbar disc   . Chronic low back pain    Has spina bifida occulta  . Heart palpitations    PVCs noted on Event Monitor - some in [place] or couplets.  . History of kidney stones   . Hyperlipidemia   . Moderate obesity   . OSA on CPAP     Past Surgical History:  Procedure Laterality Date  . OPEN ANTERIOR SHOULDER RECONSTRUCTION  1998   Right  . TRANSTHORACIC ECHOCARDIOGRAM  12/28/2013   EF 55-60%.  Normal diastolic function  . TREADMILL STRESS ECHO  01/02/2014   Ex 10:31 Min; 12.5 METS --> no EKG changes, Normal pre & post Echo Wall Motion. -- No ischemia or infarction    There were no vitals filed for this visit.  Subjective Assessment - 09/19/17 0806    Subjective  Pt reports he is able to move his R shoulder around more each day.  Pt is hoping for MRI and CT scan of R shoulder this week.  Pt can tell his R ankle/heel is weak but it is not painful.     Pertinent History  Saturday of easter weekend was at a campground, felt a pop in his heel, didn't hurt  but within an hour couldn't walk on it. Walked on it until Tuesday and had to go in and was found to be a partially torn achilles at insertion point and left foot is a partial tendintis/fasciatis from compensation. Started wearing boot on R foot and plantar fasciitis got painful to where he couldn't walk on L foot.  R foot started swelling, got prednisone and went to foot specialist. Was told to continue boot until May 31st. Sunday moving around in bed had arm above head and shoulder came out and subloxation that occurred for 2-3  minutews with patient having to put it into position that it came out. Had X ray, requested post op notes from [place] January 2008. Patient drives every day hour and ten minutes to primary office and hour twenty/thirty to [facility] office. Drives automatic, coaches soccer at night, bowed out of baseball due to not wanting to stand for prolonged periods of time.     Limitations  Sitting;Reading;Lifting;Standing;Walking;Writing;House hold activities;Other (comment)    How long can you sit comfortably?  5 minutes    How long can you stand comfortably?  5 minutes    How long can you walk comfortably?  5  minutes    Diagnostic tests  X ray    Patient Stated Goals  to reduce pain and return to PLOF    Currently in Pain?  Yes    Pain Score  4     Pain Location  Shoulder    Pain Orientation  Right    Pain Descriptors / Indicators  Aching    Pain Onset  1 to 4 weeks ago        TREATMENT   Supine isometrics: R shoulder abduction, adduction, ER, IR, Flexion, Extension 10x 5 second holds   Reverse scapular punches with support RUE 10x 6 second holds   PROM Flexion, abduction, modified IR and ER 10x10 seconds ; limited all ranges.   STM R gastroc/soleus as noted medial and lateral trigger points.   Seated adduction squeeze 10x 5 second holds ; 2 sets                         PT Education - 09/19/17 0804    Education provided  Yes    Education  Details  Exercise technique    Person(s) Educated  Patient    Methods  Explanation;Demonstration;Verbal cues    Comprehension  Verbalized understanding;Returned demonstration;Verbal cues required;Need further instruction       PT Short Term Goals - 09/09/17 1145      PT SHORT TERM GOAL #1   Title  Patient will be independent in home exercise program to improve strength/mobility for better functional independence with ADLs.    Baseline  HEP given    Time  2    Period  Weeks    Status  New    Target Date  09/23/17      PT SHORT TERM GOAL #2   Title  Patient will report a worst pain of 3/10 on VAS in ankle/feet to improve tolerance with ADLs and reduced symptoms with activities.     Baseline  4/10     Time  2    Period  Weeks    Status  New    Target Date  09/23/17      PT SHORT TERM GOAL #3   Title  Patient will report a worst pain of 4/10 on VAS in R shoulder to improve tolerance with ADLs and reduced symptoms with activities.     Baseline  6/10 R shoulder    Time  2    Period  Weeks    Status  New    Target Date  09/23/17      PT SHORT TERM GOAL #4   Title  Patient will improve R UE strength to at least 3/5 demonstrating improved ability to perform functional range of motion for ADL completion    Baseline  unable to lift arm due to fear of subluxation     Time  2    Period  Weeks    Status  New    Target Date  09/23/17      PT SHORT TERM GOAL #5   Title  Patient will ambulate without boot or AD <100 ft with equal weight bearing upon R and LLE for return to PLOF     Baseline  use boot and shape up for ambulation    Time  2    Period  Weeks    Status  New    Target Date  09/23/17        PT Long Term Goals - 09/09/17 1150  PT LONG TERM GOAL #1   Title  Patient will report a worst pain of 1/10 on VAS in feet/ankles  to improve tolerance with ADLs and reduced symptoms with activities.     Baseline  4/10 pain    Time  4    Period  Weeks    Status  New     Target Date  10/07/17      PT LONG TERM GOAL #2   Title  Patient will demonstrate adequate shoulder ROM and strength to be able to shave and dress independently with pain less than 3/10.    Baseline  6/10 pain in R shoulder , unable to lift     Time  4    Period  Weeks    Status  New    Target Date  10/07/17      PT LONG TERM GOAL #3   Title  Patient will increase lower extremity functional scale to >50/80 to demonstrate improved functional mobility and increased tolerance with ADLs.     Baseline  33/80    Time  4    Period  Weeks    Status  New    Target Date  10/07/17      PT LONG TERM GOAL #4   Title  Patient will return to golf game to allow for return to PLOF>     Baseline  unable to golf at this time    Time  4    Period  Weeks    Status  New    Target Date  10/07/17      PT LONG TERM GOAL #5   Title  Patient will decrease Quick DASH score by > 8 points ( <57%) demonstrating reduced self-reported upper extremity disability.    Baseline  5/10: 63.6%    Time  4    Period  Weeks    Status  New    Target Date  10/07/17            Plan - 09/19/17 0807    Clinical Impression Statement  Pt demonstrates significant weakness and fatigue with isometric exercises, thus continued this strengthening program.  STM to R gastroc/soleus with focus on lateral portion as increased trigger points noted in this area.  Pt responded well to all interventions. Pt to hopefully have imaging later this week. Pt will benefit from continued skilled PT interventions for decreased pain and improved functional use of RUE and RLE.      Rehab Potential  Fair    Clinical Impairments Affecting Rehab Potential  (+) age,motivation (-) multiple comorditities, hx of shoulder repair.     PT Frequency  2x / week    PT Duration  4 weeks    PT Treatment/Interventions  ADLs/Self Care Home Management;Cryotherapy;Electrical Stimulation;Moist Heat;Iontophoresis 4mg /ml Dexamethasone;Ultrasound;Therapeutic  exercise;Therapeutic activities;Functional mobility training;Stair training;Gait training;DME Instruction;Balance training;Neuromuscular re-education;Patient/family education;Orthotic Fit/Training;Compression bandaging;Manual techniques;Passive range of motion;Dry needling;Energy conservation;Splinting;Taping    PT Next Visit Plan  review HEP, stabilize R shoulder, strech and mobilize ankles     PT Home Exercise Plan  see sheet    Consulted and Agree with Plan of Care  Patient       Patient will benefit from skilled therapeutic intervention in order to improve the following deficits and impairments:  Abnormal gait, Decreased activity tolerance, Decreased balance, Decreased endurance, Decreased mobility, Decreased range of motion, Difficulty walking, Decreased strength, Hypomobility, Increased edema, Impaired flexibility, Impaired perceived functional ability, Impaired sensation, Impaired UE functional use, Postural dysfunction, Improper body  mechanics, Pain  Visit Diagnosis: Acute pain of right shoulder  Pain in left foot  Pain in right foot  Abnormality of gait and mobility     Problem List Patient Active Problem List   Diagnosis Date Noted  . Chest pain 06/30/2017  . Spleen enlarged 12/06/2016  . Acute cervical sprain 12/25/2015  . IBS (irritable bowel syndrome) 12/19/2015  . PVC's (premature ventricular contractions) 12/07/2013  . Palpitations 11/28/2013  . Obesity, morbid, BMI 40.0-49.9 ([place]) 10/22/2013  . Routine general medical examination at a health care facility 09/29/2012  . OSA (obstructive sleep apnea) 09/29/2012  . Chronic low back pain   . Allergic rhinitis due to pollen     Elijah Logan PT, DPT 09/19/2017, 8:43 AM  [place] MAIN [facility] SERVICES [address] [place], [place], [address] Phone: [phone]   Fax:  [phone]  Name: Elijah Logan MRN: [id number] Date of Birth: 08-14-1980

## 2017-09-20 DIAGNOSIS — M7661 Achilles tendinitis, right leg: Secondary | ICD-10-CM | POA: Diagnosis not present

## 2017-09-21 ENCOUNTER — Ambulatory Visit: Payer: BLUE CROSS/BLUE SHIELD | Admitting: Physical Therapy

## 2017-09-21 ENCOUNTER — Encounter: Payer: Self-pay | Admitting: Physical Therapy

## 2017-09-21 DIAGNOSIS — M79672 Pain in left foot: Secondary | ICD-10-CM | POA: Diagnosis not present

## 2017-09-21 DIAGNOSIS — M79671 Pain in right foot: Secondary | ICD-10-CM

## 2017-09-21 DIAGNOSIS — M25511 Pain in right shoulder: Secondary | ICD-10-CM | POA: Diagnosis not present

## 2017-09-21 DIAGNOSIS — R269 Unspecified abnormalities of gait and mobility: Secondary | ICD-10-CM | POA: Diagnosis not present

## 2017-09-21 NOTE — Therapy (Signed)
[place] MAIN [facility] SERVICES [address] [place], [place], [address] Phone: [phone]   Fax:  [phone]  Physical Therapy Treatment  Patient Details  Name: Elijah Logan MRN: [id number] Date of Birth: 08/30/1980 Referring Provider: Raliegh Ip, Dr Griffin Basil   Encounter Date: 09/21/2017  PT End of Session - 09/21/17 0805    Visit Number  5    Number of Visits  8    Date for PT Re-Evaluation  10/07/17    Authorization Type  5/10    PT Start Time  0804    PT Stop Time  0844    PT Time Calculation (min)  40 min    Equipment Utilized During Treatment  -- R sling, R immobilizer    Activity Tolerance  Patient tolerated treatment well;Patient limited by fatigue;Treatment limited secondary to medical complications (Comment)    Behavior During Therapy  [facility] for tasks assessed/performed       Past Medical History:  Diagnosis Date  . Allergic rhinitis due to pollen    mild  . Bulging lumbar disc   . Chronic low back pain    Has spina bifida occulta  . Heart palpitations    PVCs noted on Event Monitor - some in [place] or couplets.  . History of kidney stones   . Hyperlipidemia   . Moderate obesity   . OSA on CPAP     Past Surgical History:  Procedure Laterality Date  . OPEN ANTERIOR SHOULDER RECONSTRUCTION  1998   Right  . TRANSTHORACIC ECHOCARDIOGRAM  12/28/2013   EF 55-60%.  Normal diastolic function  . TREADMILL STRESS ECHO  01/02/2014   Ex 10:31 Min; 12.5 METS --> no EKG changes, Normal pre & post Echo Wall Motion. -- No ischemia or infarction    There were no vitals filed for this visit.  Subjective Assessment - 09/21/17 0809    Subjective  Pt's MRI is scheduled for next Tuesday 5/28.  Pt saw Dr. Diona Browner yesterday for a follow up appointment regarding his R ankle.  Pt brought protocol from Dr. Diona Browner office that allows pt to progressively increase amount of time out of his boot.  Pt needs to wear a shoe when out of the brace.       Pertinent History  Saturday of easter weekend was at a campground, felt a pop in his heel, didn't hurt but within an hour couldn't walk on it. Walked on it until Tuesday and had to go in and was found to be a partially torn achilles at insertion point and left foot is a partial tendintis/fasciatis from compensation. Started wearing boot on R foot and plantar fasciitis got painful to where he couldn't walk on L foot.  R foot started swelling, got prednisone and went to foot specialist. Was told to continue boot until May 31st. Sunday moving around in bed had arm above head and shoulder came out and subloxation that occurred for 2-3  minutews with patient having to put it into position that it came out. Had X ray, requested post op notes from [place] January 2008. Patient drives every day hour and ten minutes to primary office and hour twenty/thirty to [facility] office. Drives automatic, coaches soccer at night, bowed out of baseball due to not wanting to stand for prolonged periods of time.     Limitations  Sitting;Reading;Lifting;Standing;Walking;Writing;House hold activities;Other (comment)    How long can you sit comfortably?  5 minutes    How long can you stand  comfortably?  5 minutes    How long can you walk comfortably?  5 minutes    Diagnostic tests  X ray    Patient Stated Goals  to reduce pain and return to PLOF    Currently in Pain?  No/denies    Pain Onset  --          TREATMENT  Reverse scapular punches with support RUE 10x 6 second holds  PROM Flexion, abduction, modified IR and ER 10x10 seconds; limited all ranges.  Supine R ankle AROM PF/DF/Inv/Ev x10 each direction  BAPS board level 2 DF/PF/Inv/EV and clockwise and counterclockwise circles x20 each direction.  Seated R ankle isometrics 4 way with 5 second holds x5 (added to HEP)                        PT Education - 09/21/17 0805    Education provided  Yes    Education Details  Exercise technique;  emphasized the importance of adhering to protocol    Person(s) Educated  Patient    Methods  Explanation;Demonstration;Verbal cues    Comprehension  Verbalized understanding;Returned demonstration;Verbal cues required;Need further instruction       PT Short Term Goals - 09/09/17 1145      PT SHORT TERM GOAL #1   Title  Patient will be independent in home exercise program to improve strength/mobility for better functional independence with ADLs.    Baseline  HEP given    Time  2    Period  Weeks    Status  New    Target Date  09/23/17      PT SHORT TERM GOAL #2   Title  Patient will report a worst pain of 3/10 on VAS in ankle/feet to improve tolerance with ADLs and reduced symptoms with activities.     Baseline  4/10     Time  2    Period  Weeks    Status  New    Target Date  09/23/17      PT SHORT TERM GOAL #3   Title  Patient will report a worst pain of 4/10 on VAS in R shoulder to improve tolerance with ADLs and reduced symptoms with activities.     Baseline  6/10 R shoulder    Time  2    Period  Weeks    Status  New    Target Date  09/23/17      PT SHORT TERM GOAL #4   Title  Patient will improve R UE strength to at least 3/5 demonstrating improved ability to perform functional range of motion for ADL completion    Baseline  unable to lift arm due to fear of subluxation     Time  2    Period  Weeks    Status  New    Target Date  09/23/17      PT SHORT TERM GOAL #5   Title  Patient will ambulate without boot or AD <100 ft with equal weight bearing upon R and LLE for return to PLOF     Baseline  use boot and shape up for ambulation    Time  2    Period  Weeks    Status  New    Target Date  09/23/17        PT Long Term Goals - 09/09/17 1150      PT LONG TERM GOAL #1   Title  Patient will report a worst  pain of 1/10 on VAS in feet/ankles  to improve tolerance with ADLs and reduced symptoms with activities.     Baseline  4/10 pain    Time  4    Period  Weeks     Status  New    Target Date  10/07/17      PT LONG TERM GOAL #2   Title  Patient will demonstrate adequate shoulder ROM and strength to be able to shave and dress independently with pain less than 3/10.    Baseline  6/10 pain in R shoulder , unable to lift     Time  4    Period  Weeks    Status  New    Target Date  10/07/17      PT LONG TERM GOAL #3   Title  Patient will increase lower extremity functional scale to >50/80 to demonstrate improved functional mobility and increased tolerance with ADLs.     Baseline  33/80    Time  4    Period  Weeks    Status  New    Target Date  10/07/17      PT LONG TERM GOAL #4   Title  Patient will return to golf game to allow for return to PLOF>     Baseline  unable to golf at this time    Time  4    Period  Weeks    Status  New    Target Date  10/07/17      PT LONG TERM GOAL #5   Title  Patient will decrease Quick DASH score by > 8 points ( <57%) demonstrating reduced self-reported upper extremity disability.    Baseline  5/10: 63.6%    Time  4    Period  Weeks    Status  New    Target Date  10/07/17            Plan - 09/21/17 0814    Clinical Impression Statement  Pt received new protocol for R ankle/achilles with orders to gradually increase amount of time out of his boot and walking in a supportive shoe.  Thus, focused today's session on R ankle light strengthening and AROM/AAROM exercises.  Pt tolerated isometric strengthening exercises this session and was provided with HEP handout to perform at home. Pt will benefit from continued skilled PT interventions for improved ROM, strength, and functional independence.      Rehab Potential  Fair    Clinical Impairments Affecting Rehab Potential  (+) age,motivation (-) multiple comorditities, hx of shoulder repair.     PT Frequency  2x / week    PT Duration  4 weeks    PT Treatment/Interventions  ADLs/Self Care Home Management;Cryotherapy;Electrical Stimulation;Moist  Heat;Iontophoresis 4mg /ml Dexamethasone;Ultrasound;Therapeutic exercise;Therapeutic activities;Functional mobility training;Stair training;Gait training;DME Instruction;Balance training;Neuromuscular re-education;Patient/family education;Orthotic Fit/Training;Compression bandaging;Manual techniques;Passive range of motion;Dry needling;Energy conservation;Splinting;Taping    PT Next Visit Plan  review HEP, stabilize R shoulder, strech and mobilize ankles     PT Home Exercise Plan  see sheet; ankle 4 way isometrics    Consulted and Agree with Plan of Care  Patient       Patient will benefit from skilled therapeutic intervention in order to improve the following deficits and impairments:  Abnormal gait, Decreased activity tolerance, Decreased balance, Decreased endurance, Decreased mobility, Decreased range of motion, Difficulty walking, Decreased strength, Hypomobility, Increased edema, Impaired flexibility, Impaired perceived functional ability, Impaired sensation, Impaired UE functional use, Postural dysfunction, Improper body mechanics, Pain  Visit Diagnosis:  Acute pain of right shoulder  Pain in left foot  Pain in right foot  Abnormality of gait and mobility     Problem List Patient Active Problem List   Diagnosis Date Noted  . Chest pain 06/30/2017  . Spleen enlarged 12/06/2016  . Acute cervical sprain 12/25/2015  . IBS (irritable bowel syndrome) 12/19/2015  . PVC's (premature ventricular contractions) 12/07/2013  . Palpitations 11/28/2013  . Obesity, morbid, BMI 40.0-49.9 ([place]) 10/22/2013  . Routine general medical examination at a health care facility 09/29/2012  . OSA (obstructive sleep apnea) 09/29/2012  . Chronic low back pain   . Allergic rhinitis due to pollen     Collie Siad PT, DPT 09/21/2017, 8:46 AM  [place] MAIN [facility] SERVICES [address] [place], [place], [address] Phone: [phone]   Fax:  [phone]  Name:  Elijah Logan MRN: [id number] Date of Birth: 1981/02/23

## 2017-09-27 ENCOUNTER — Ambulatory Visit
Admission: RE | Admit: 2017-09-27 | Discharge: 2017-09-27 | Disposition: A | Discharge status: Home / Self Care | Payer: BLUE CROSS/BLUE SHIELD | Source: Ambulatory Visit | Attending: Orthopaedic Surgery | Admitting: Orthopaedic Surgery

## 2017-09-27 DIAGNOSIS — M25511 Pain in right shoulder: Principal | ICD-10-CM

## 2017-09-27 DIAGNOSIS — G8929 Other chronic pain: Secondary | ICD-10-CM

## 2017-09-27 DIAGNOSIS — M2530 Other instability, unspecified joint: Secondary | ICD-10-CM

## 2017-09-27 DIAGNOSIS — S43431A Superior glenoid labrum lesion of right shoulder, initial encounter: Secondary | ICD-10-CM | POA: Diagnosis not present

## 2017-09-27 MED ORDER — IOPAMIDOL (ISOVUE-M 200) INJECTION 41%
14.0000 mL | Freq: Once | INTRAMUSCULAR | Status: AC
  Administered 2017-09-27: 14 mL via INTRA_ARTICULAR

## 2017-09-29 ENCOUNTER — Other Ambulatory Visit: Payer: BLUE CROSS/BLUE SHIELD

## 2017-10-03 ENCOUNTER — Encounter: Payer: Self-pay | Admitting: Physical Therapy

## 2017-10-03 ENCOUNTER — Ambulatory Visit: Payer: BLUE CROSS/BLUE SHIELD | Attending: Orthopaedic Surgery | Admitting: Physical Therapy

## 2017-10-03 DIAGNOSIS — M25511 Pain in right shoulder: Secondary | ICD-10-CM | POA: Insufficient documentation

## 2017-10-03 DIAGNOSIS — M79671 Pain in right foot: Secondary | ICD-10-CM | POA: Insufficient documentation

## 2017-10-03 DIAGNOSIS — R269 Unspecified abnormalities of gait and mobility: Secondary | ICD-10-CM | POA: Diagnosis not present

## 2017-10-03 DIAGNOSIS — M79672 Pain in left foot: Secondary | ICD-10-CM | POA: Insufficient documentation

## 2017-10-03 NOTE — Therapy (Signed)
[place] MAIN [facility] SERVICES [address] [place], [place], [address] Phone: [phone]   Fax:  [phone]  Physical Therapy Treatment  Patient Details  Name: Elijah Logan MRN: [id number] Date of Birth: 18-Apr-1981 Referring Provider: Raliegh Ip, Dr Griffin Basil   Encounter Date: 10/03/2017  PT End of Session - 10/03/17 0818    Visit Number  6    Number of Visits  8    Date for PT Re-Evaluation  10/07/17    Authorization Type  6/10    PT Start Time  0800    PT Stop Time  0845    PT Time Calculation (min)  45 min    Equipment Utilized During Treatment  -- R sling, R immobilizer    Activity Tolerance  Patient tolerated treatment well;Patient limited by fatigue;Treatment limited secondary to medical complications (Comment)    Behavior During Therapy  [facility] for tasks assessed/performed       Past Medical History:  Diagnosis Date  . Allergic rhinitis due to pollen    mild  . Bulging lumbar disc   . Chronic low back pain    Has spina bifida occulta  . Heart palpitations    PVCs noted on Event Monitor - some in Bigeminy or couplets.  . History of kidney stones   . Hyperlipidemia   . Moderate obesity   . OSA on CPAP     Past Surgical History:  Procedure Laterality Date  . OPEN ANTERIOR SHOULDER RECONSTRUCTION  1998   Right  . TRANSTHORACIC ECHOCARDIOGRAM  12/28/2013   EF 55-60%.  Normal diastolic function  . TREADMILL STRESS ECHO  01/02/2014   Ex 10:31 Min; 12.5 METS --> no EKG changes, Normal pre & post Echo Wall Motion. -- No ischemia or infarction    There were no vitals filed for this visit.  Subjective Assessment - 10/03/17 0816    Subjective  Patient did not wear his sling friday thru Sunday, except at night. He is having stiffness in ankle and no pain. He is having a little throbing in shoulder     Pertinent History  Saturday of easter weekend was at a campground, felt a pop in his heel, didn't hurt but within an hour couldn't  walk on it. Walked on it until Tuesday and had to go in and was found to be a partially torn achilles at insertion point and left foot is a partial tendintis/fasciatis from compensation. Started wearing boot on R foot and plantar fasciitis got painful to where he couldn't walk on L foot.  R foot started swelling, got prednisone and went to foot specialist. Was told to continue boot until May 31st. Sunday moving around in bed had arm above head and shoulder came out and subloxation that occurred for 2-3  minutews with patient having to put it into position that it came out. Had X ray, requested post op notes from [place] January 2008. Patient drives every day hour and ten minutes to primary office and hour twenty/thirty to [facility] office. Drives automatic, coaches soccer at night, bowed out of baseball due to not wanting to stand for prolonged periods of time.     Limitations  Sitting;Reading;Lifting;Standing;Walking;Writing;House hold activities;Other (comment)    How long can you sit comfortably?  5 minutes    How long can you stand comfortably?  5 minutes    How long can you walk comfortably?  5 minutes    Diagnostic tests  X ray    Patient  Stated Goals  to reduce pain and return to PLOF    Currently in Pain?  No/denies    Pain Score  0-No pain    Pain Onset  1 to 4 weeks ago    Pain Onset  In the past 7 days       TREATMENT  YTB B ankle eversion x 20 , PF with YTB x 20 , DF with YTB x 20.  Supine R ankle AROM PF/DF/Inv/Ev x10 each direction  BAPS board level 2 DF/PF/Inv/EV and clockwise and counterclockwise circles x20 each direction.  Seated R ankle isometrics 4 way with 5 second holds x5 (added to HEP)  Gastroc/soleus stretch L ankle 30 sec x 3  Hamstring stretch BLE 30 sec x 3 Manual therapy; Roller stick to right calf x 8 mins with tenderness that decreased at end of the treatment.                        PT Education - 10/03/17 0817    Education provided  Yes     Education Details  HEP    Person(s) Educated  Patient    Methods  Explanation    Comprehension  Verbalized understanding       PT Short Term Goals - 09/09/17 1145      PT SHORT TERM GOAL #1   Title  Patient will be independent in home exercise program to improve strength/mobility for better functional independence with ADLs.    Baseline  HEP given    Time  2    Period  Weeks    Status  New    Target Date  09/23/17      PT SHORT TERM GOAL #2   Title  Patient will report a worst pain of 3/10 on VAS in ankle/feet to improve tolerance with ADLs and reduced symptoms with activities.     Baseline  4/10     Time  2    Period  Weeks    Status  New    Target Date  09/23/17      PT SHORT TERM GOAL #3   Title  Patient will report a worst pain of 4/10 on VAS in R shoulder to improve tolerance with ADLs and reduced symptoms with activities.     Baseline  6/10 R shoulder    Time  2    Period  Weeks    Status  New    Target Date  09/23/17      PT SHORT TERM GOAL #4   Title  Patient will improve R UE strength to at least 3/5 demonstrating improved ability to perform functional range of motion for ADL completion    Baseline  unable to lift arm due to fear of subluxation     Time  2    Period  Weeks    Status  New    Target Date  09/23/17      PT SHORT TERM GOAL #5   Title  Patient will ambulate without boot or AD <100 ft with equal weight bearing upon R and LLE for return to PLOF     Baseline  use boot and shape up for ambulation    Time  2    Period  Weeks    Status  New    Target Date  09/23/17        PT Long Term Goals - 09/09/17 1150      PT LONG TERM GOAL #1  Title  Patient will report a worst pain of 1/10 on VAS in feet/ankles  to improve tolerance with ADLs and reduced symptoms with activities.     Baseline  4/10 pain    Time  4    Period  Weeks    Status  New    Target Date  10/07/17      PT LONG TERM GOAL #2   Title  Patient will demonstrate adequate  shoulder ROM and strength to be able to shave and dress independently with pain less than 3/10.    Baseline  6/10 pain in R shoulder , unable to lift     Time  4    Period  Weeks    Status  New    Target Date  10/07/17      PT LONG TERM GOAL #3   Title  Patient will increase lower extremity functional scale to >50/80 to demonstrate improved functional mobility and increased tolerance with ADLs.     Baseline  33/80    Time  4    Period  Weeks    Status  New    Target Date  10/07/17      PT LONG TERM GOAL #4   Title  Patient will return to golf game to allow for return to PLOF>     Baseline  unable to golf at this time    Time  4    Period  Weeks    Status  New    Target Date  10/07/17      PT LONG TERM GOAL #5   Title  Patient will decrease Quick DASH score by > 8 points ( <57%) demonstrating reduced self-reported upper extremity disability.    Baseline  5/10: 63.6%    Time  4    Period  Weeks    Status  New    Target Date  10/07/17            Plan - 10/03/17 1040    Clinical Impression Statement  Patient has no pain today and performs AROM and RROM with yellow theraband to right ankle, stretching for left ankle gastroc and soleus and B hamstrings. He has no reports of pain during treatment. He tolerated manual therapy with roller stick to right calf with pain and tenderness initially and  decreasing at the end of treatment. Patient will benefit from continued skileld PT to improve strength and mobility.     Rehab Potential  Fair    Clinical Impairments Affecting Rehab Potential  (+) age,motivation (-) multiple comorditities, hx of shoulder repair.     PT Frequency  2x / week    PT Duration  4 weeks    PT Treatment/Interventions  ADLs/Self Care Home Management;Cryotherapy;Electrical Stimulation;Moist Heat;Iontophoresis 4mg /ml Dexamethasone;Ultrasound;Therapeutic exercise;Therapeutic activities;Functional mobility training;Stair training;Gait training;DME Instruction;Balance  training;Neuromuscular re-education;Patient/family education;Orthotic Fit/Training;Compression bandaging;Manual techniques;Passive range of motion;Dry needling;Energy conservation;Splinting;Taping    PT Next Visit Plan  review HEP, stabilize R shoulder, strech and mobilize ankles     PT Home Exercise Plan  see sheet; ankle 4 way isometrics    Consulted and Agree with Plan of Care  Patient       Patient will benefit from skilled therapeutic intervention in order to improve the following deficits and impairments:  Abnormal gait, Decreased activity tolerance, Decreased balance, Decreased endurance, Decreased mobility, Decreased range of motion, Difficulty walking, Decreased strength, Hypomobility, Increased edema, Impaired flexibility, Impaired perceived functional ability, Impaired sensation, Impaired UE functional use, Postural dysfunction, Improper body mechanics,  Pain  Visit Diagnosis: Acute pain of right shoulder  Pain in left foot  Pain in right foot  Abnormality of gait and mobility     Problem List Patient Active Problem List   Diagnosis Date Noted  . Chest pain 06/30/2017  . Spleen enlarged 12/06/2016  . Acute cervical sprain 12/25/2015  . IBS (irritable bowel syndrome) 12/19/2015  . PVC's (premature ventricular contractions) 12/07/2013  . Palpitations 11/28/2013  . Obesity, morbid, BMI 40.0-49.9 ([place]) 10/22/2013  . Routine general medical examination at a health care facility 09/29/2012  . OSA (obstructive sleep apnea) 09/29/2012  . Chronic low back pain   . Allergic rhinitis due to pollen     Alanson Puls, PT DPT 10/03/2017, 10:44 AM  [place] MAIN [facility] SERVICES [address] [place], [place], [address] Phone: [phone]   Fax:  [phone]  Name: Elijah Logan MRN: [id number] Date of Birth: 1981-02-20

## 2017-10-04 DIAGNOSIS — M25511 Pain in right shoulder: Secondary | ICD-10-CM | POA: Diagnosis not present

## 2017-10-05 ENCOUNTER — Encounter: Payer: BLUE CROSS/BLUE SHIELD | Admitting: Physical Therapy

## 2017-10-10 DIAGNOSIS — M25311 Other instability, right shoulder: Secondary | ICD-10-CM | POA: Diagnosis not present

## 2017-10-25 DIAGNOSIS — M25611 Stiffness of right shoulder, not elsewhere classified: Secondary | ICD-10-CM | POA: Diagnosis not present

## 2017-10-25 DIAGNOSIS — M25511 Pain in right shoulder: Secondary | ICD-10-CM | POA: Diagnosis not present

## 2018-02-07 DIAGNOSIS — G4733 Obstructive sleep apnea (adult) (pediatric): Secondary | ICD-10-CM | POA: Diagnosis not present

## 2018-03-21 ENCOUNTER — Encounter: Payer: BLUE CROSS/BLUE SHIELD | Admitting: Internal Medicine

## 2018-03-21 DIAGNOSIS — Z0289 Encounter for other administrative examinations: Secondary | ICD-10-CM

## 2018-07-25 DIAGNOSIS — H11422 Conjunctival edema, left eye: Secondary | ICD-10-CM | POA: Diagnosis not present

## 2018-09-07 ENCOUNTER — Telehealth: Payer: Self-pay

## 2018-09-07 NOTE — Telephone Encounter (Signed)
Called patient from recall list.  No answer. LMOV.  This is the 3rd attempt. Will delete recall.

## 2018-09-21 DIAGNOSIS — G4733 Obstructive sleep apnea (adult) (pediatric): Secondary | ICD-10-CM | POA: Diagnosis not present

## 2019-01-03 ENCOUNTER — Other Ambulatory Visit: Payer: Self-pay

## 2019-01-03 DIAGNOSIS — Z20822 Contact with and (suspected) exposure to covid-19: Secondary | ICD-10-CM

## 2019-01-03 DIAGNOSIS — R6889 Other general symptoms and signs: Secondary | ICD-10-CM | POA: Diagnosis not present

## 2019-01-04 LAB — NOVEL CORONAVIRUS, NAA: SARS-CoV-2, NAA: NOT DETECTED

## 2019-01-18 ENCOUNTER — Ambulatory Visit (INDEPENDENT_AMBULATORY_CARE_PROVIDER_SITE_OTHER)
Admission: RE | Admit: 2019-01-18 | Discharge: 2019-01-18 | Disposition: A | Discharge status: Home / Self Care | Payer: BC Managed Care – PPO | Source: Ambulatory Visit

## 2019-01-18 DIAGNOSIS — R05 Cough: Secondary | ICD-10-CM

## 2019-01-18 DIAGNOSIS — Z20822 Contact with and (suspected) exposure to covid-19: Secondary | ICD-10-CM

## 2019-01-18 DIAGNOSIS — Z20828 Contact with and (suspected) exposure to other viral communicable diseases: Secondary | ICD-10-CM | POA: Diagnosis not present

## 2019-01-18 MED ORDER — BENZONATATE 100 MG PO CAPS: 100.0000 mg | ORAL_CAPSULE | Freq: Three times a day (TID) | ORAL | 0 refills | Status: DC

## 2019-01-18 NOTE — ED Provider Notes (Signed)
Virtual Visit via Video Note:  Elijah Logan  initiated request for Telemedicine visit with [facility] team. I connected with Elijah Logan  on 01/18/2019 at 8:40 AM  for a synchronized telemedicine visit using a video enabled HIPPA compliant telemedicine application. I verified that I am speaking with Elijah Logan  using two identifiers. [place] Hall-Potvin, PA-C  was physically located in a [facility] site and Bridget Woznick was located at a different location.   The limitations of evaluation and management by telemedicine as well as the availability of in-person appointments were discussed. Patient was informed that he  may incur a bill ( including co-pay) for this virtual visit encounter. Elijah Logan  expressed understanding and gave verbal consent to proceed with virtual visit.     History of Present Illness:Elijah Logan  is a 38 y.o. male presents with 2-week course of fatigue, nasal congestion, myalgias.  Initially tested 9/2 for COVID: Negative.  States his wife works as an NP at a clinic and she tested positive over a week ago.  Is been treating him empirically for COVID with supportive measures.  Patient has not really been taking much other known single dose of Tylenol, albuterol inhaler which helped with his dyspnea.  Patient endorsing loss of taste/smell since Saturday.  States last night he felt winded when eating dinner with a little bit of lightheadedness, has been feeling better today.  Patient has been working this entire time from home.  Past Medical History:  Diagnosis Date  . Allergic rhinitis due to pollen    mild  . Bulging lumbar disc   . Chronic low back pain    Has spina bifida occulta  . Heart palpitations    PVCs noted on Event Monitor - some in [place] or couplets.  . History of kidney stones   . Hyperlipidemia   . Moderate obesity   . OSA on CPAP     No Known Allergies   Review of Systems  Constitutional: Positive for malaise/fatigue. Negative for  fever.  HENT: Positive for congestion. Negative for ear pain, sinus pain and sore throat.   Eyes: Negative for pain and redness.  Respiratory: Positive for cough. Negative for hemoptysis, sputum production, shortness of breath, wheezing and stridor.   Cardiovascular: Negative for chest pain, palpitations and leg swelling.  Gastrointestinal: Negative for abdominal pain, diarrhea and vomiting.  Musculoskeletal: Negative for joint pain and myalgias.  Skin: Negative for itching and rash.  Neurological: Positive for sensory change. Negative for dizziness, speech change, focal weakness and headaches.     Observations/Objective: 38 year old male sitting in no acute distress.  Patient is able to speak in full sentences without coughing, sneezing, wheezing.  Assessment and Plan: 1.  Suspect COVID-19 Reviewed supportive treatment/management.  ER return precautions, as well as utility in buying pulse oximeter at the pharmacy.  Will add benzonatate for cough relief.  Low concern for pneumonia at this time due to lack of fever, productive cough, confirmed close COVID exposure.  Follow Up Instructions: Patient to follow-up with primary care in 1 week, go to ER sooner if he develops symptoms, fever, shortness of breath, chest pain.   I discussed the assessment and treatment plan with the patient. The patient was provided an opportunity to ask questions and all were answered. The patient agreed with the plan and demonstrated an understanding of the instructions.   The patient was advised to call back or seek an in-person evaluation if the symptoms worsen or  if the condition fails to improve as anticipated.  I provided 15 minutes of non-face-to-face time during this encounter.    [place], PA-C  01/18/2019 8:40 AM        Hall-Potvin, [place], PA-C 01/18/19 [phone]

## 2019-01-18 NOTE — Discharge Instructions (Signed)
You may take OTC Tylenol for fever and myalgias.  It is important to drink plenty of water throughout the day to stay hydrated. If you test positive and later develop severe fever, cough, or shortness of breath, it is recommended that you go to the ER for further evaluation.

## 2019-06-04 ENCOUNTER — Other Ambulatory Visit: Payer: Self-pay

## 2019-06-04 ENCOUNTER — Encounter: Payer: Self-pay | Admitting: Internal Medicine

## 2019-06-04 ENCOUNTER — Ambulatory Visit (INDEPENDENT_AMBULATORY_CARE_PROVIDER_SITE_OTHER): Payer: BC Managed Care – PPO | Admitting: Internal Medicine

## 2019-06-04 DIAGNOSIS — K219 Gastro-esophageal reflux disease without esophagitis: Secondary | ICD-10-CM | POA: Diagnosis not present

## 2019-06-04 DIAGNOSIS — K589 Irritable bowel syndrome without diarrhea: Secondary | ICD-10-CM

## 2019-06-04 DIAGNOSIS — F439 Reaction to severe stress, unspecified: Secondary | ICD-10-CM | POA: Diagnosis not present

## 2019-06-04 MED ORDER — LORAZEPAM 0.5 MG PO TABS: 0.5000 mg | ORAL_TABLET | Freq: Two times a day (BID) | ORAL | 0 refills | Status: DC | PRN

## 2019-06-04 MED ORDER — OMEPRAZOLE 20 MG PO CPDR: 20.0000 mg | DELAYED_RELEASE_CAPSULE | Freq: Every day | ORAL | 3 refills | Status: DC

## 2019-06-04 NOTE — Assessment & Plan Note (Signed)
Chronic symptoms are now worsened with stress, irregular eating, poor sleep, etc Discussed lifestyle Will restart omeprazole

## 2019-06-04 NOTE — Assessment & Plan Note (Signed)
This has not been the major issue for him

## 2019-06-04 NOTE — Assessment & Plan Note (Signed)
Work is out of control. Trouble controlling the work---and having bad feelings and worried about his health No MDD and not GAD---all reactive I don't think SSRI would help him He needs to cut back on his hours/delegate more---- so he has more time off Will give Rx for lorazepam to "quiet his mind"---at least at night

## 2019-06-04 NOTE — Progress Notes (Signed)
Subjective:    Patient ID: Elijah Logan, male    DOB: 1980-06-11, 39 y.o.   MRN: [id number]  HPI Here due to stress and other issues This visit occurred during the SARS-CoV-2 public health emergency.  Safety protocols were in place, including screening questions prior to the visit, additional usage of staff PPE, and extensive cleaning of exam room while observing appropriate contact time as indicated for disinfecting solutions.   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 cyclobenzaprine (FLEXERIL) 10 MG tablet Take 0.5-1 tablets (5-10 mg total) by mouth 3 times/day as needed-between meals & bedtime for muscle spasms. 15 tablet 0  . fluticasone (FLONASE) 50 MCG/ACT nasal spray Place 2 sprays into both nostrils as needed.     . hyoscyamine (LEVSIN SL) 0.125 MG SL tablet Place 1 tablet (0.125 mg total) every 4 (four) hours as needed under the tongue. 60 tablet 3  . ibuprofen (MOTRIN IB) 200 MG tablet Take 3 tablets (600 mg total) by mouth every 6 (six) hours as needed. 20 tablet 0  . loratadine (CLARITIN) 10 MG tablet Take 10 mg by mouth daily.    Marland Kitchen omeprazole (PRILOSEC) 20 MG capsule Take 1 capsule (20 mg total) by mouth daily. (Patient not taking: Reported on 06/04/2019) 90 capsule 3   No current facility-administered medications on file prior to visit.      No Known Allergies  Past Medical History:  Diagnosis Date  . Allergic rhinitis due to pollen    mild  . Bulging lumbar disc   . Chronic low back pain    Has spina bifida occulta  . Heart palpitations    PVCs noted on Event Monitor - some in [place]eminy or couplets.  . History of kidney stones   . Hyperlipidemia   . Moderate obesity   . OSA on CPAP     Past Surgical History:  Procedure Laterality Date  . OPEN ANTERIOR SHOULDER RECONSTRUCTION  1998   Right  . TRANSTHORACIC ECHOCARDIOGRAM  12/28/2013   EF 55-60%.  Normal diastolic function  . TREADMILL STRESS ECHO  01/02/2014   Ex 10:31 Min; 12.5 METS --> no EKG changes, Normal pre & post Echo Wall Motion. -- No ischemia or infarction    Family History  Problem Relation Age of Onset  . Diabetes Mother        borderline  . Diabetes Father        borderline  . Heart disease Father        MI and needed defibrillation; PCI x3  . Heart attack Father   . Hypertension Other   . Stroke Other   . Cancer Other        prostate cancer  . Diabetes Maternal Grandmother   . Diabetes Paternal Grandmother   . Prostate cancer Other   . Kidney cancer Neg Hx   .  Bladder Cancer Neg Hx     Social History   Socioeconomic History  . Marital status: Married    Spouse name: Not on file  . Number of children: 2  . Years of education: Not on file  . Highest education level: Not on file  Occupational History  . 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 Smoking status: Never Smoker  . Smokeless tobacco: Never Used  Substance and Sexual Activity  . Alcohol use: Yes    Comment: 0-1week  . Drug use: No  . Sexual activity: Not on file  Other Topics Concern  . Not on file  Social History Narrative      He currently works for [employer] in [place]. His wife is a Designer, jewellery at [place]  Strain:   . Difficulty of Paying Living Expenses: Not on file  Food Insecurity:   . Worried About Charity fundraiser in the Last Year: Not on file  . Ran Out of Food in the Last Year: Not on file  Transportation Needs:   . Lack of Transportation (Medical): Not on file  . Lack of Transportation (Non-Medical): Not on file  Physical Activity:   . Days of Exercise per Week: Not on file  . Minutes of Exercise per Session: Not on file  Stress:   . Feeling of Stress : Not on file  Social Connections:   . Frequency of Communication with Friends and Family: Not on file  . Frequency of Social Gatherings with Friends and Family: Not on file  . Attends Religious Services: Not on file  . Active Member of Clubs or Organizations: Not on file  . Attends Archivist Meetings: Not on file  . Marital Status: Not on file  Intimate Partner Violence:   . Fear of Current or Ex-Partner: Not on file  . Emotionally Abused: Not on file  . Physically Abused: Not on file  . Sexually Abused: Not on file   Review of Systems Trouble sleeping at times---mind "running a hundred miles an hour" Has lost some weight recently---no different than his last visit though Feels his memory has been affected by having "so many things going on every day" Rare alcohol--only on vacation He has turned to prayer for some strength Had some palpitations last year from stress--but not recently    Objective:   Physical Exam  Constitutional: He appears well-developed. No distress.  Cardiovascular: Normal rate, regular rhythm and normal heart sounds. Exam reveals no gallop.  No murmur heard. Respiratory: Effort normal and breath sounds normal. No respiratory distress. He has no wheezes. He has no rales.  GI: Soft. There is no abdominal tenderness.  Musculoskeletal:        General: No edema.  Psychiatric:  No overt depression No really anxious Clear cognition Normal appearance and speech           Assessment  & Plan:

## 2019-06-28 DIAGNOSIS — J358 Other chronic diseases of tonsils and adenoids: Secondary | ICD-10-CM | POA: Diagnosis not present

## 2019-06-28 DIAGNOSIS — G4733 Obstructive sleep apnea (adult) (pediatric): Secondary | ICD-10-CM | POA: Diagnosis not present

## 2019-09-07 ENCOUNTER — Other Ambulatory Visit: Payer: Self-pay

## 2019-09-07 ENCOUNTER — Ambulatory Visit (INDEPENDENT_AMBULATORY_CARE_PROVIDER_SITE_OTHER): Payer: BC Managed Care – PPO | Admitting: Internal Medicine

## 2019-09-07 ENCOUNTER — Encounter: Payer: Self-pay | Admitting: Internal Medicine

## 2019-09-07 VITALS — BP 122/80 | HR 93 | Temp 97.7°F | Ht 70.25 in | Wt 321.0 lb

## 2019-09-07 DIAGNOSIS — K219 Gastro-esophageal reflux disease without esophagitis: Secondary | ICD-10-CM | POA: Diagnosis not present

## 2019-09-07 DIAGNOSIS — K580 Irritable bowel syndrome with diarrhea: Secondary | ICD-10-CM

## 2019-09-07 DIAGNOSIS — Z0000 Encounter for general adult medical examination without abnormal findings: Secondary | ICD-10-CM

## 2019-09-07 LAB — LIPID PANEL
Cholesterol: 184 mg/dL (ref 0–200)
HDL: 31.4 mg/dL — ABNORMAL LOW (ref >39.00)
LDL Cholesterol: 119 mg/dL — ABNORMAL HIGH (ref 0–99)
NonHDL: 152.82
Total CHOL/HDL Ratio: 6
Triglycerides: 167 mg/dL — ABNORMAL HIGH (ref 0.0–149.0)
VLDL: 33.4 mg/dL (ref 0.0–40.0)

## 2019-09-07 LAB — COMPREHENSIVE METABOLIC PANEL
ALT: 28 U/L (ref 0–53)
AST: 15 U/L (ref 0–37)
Albumin: 4.4 g/dL (ref 3.5–5.2)
Alkaline Phosphatase: 58 U/L (ref 39–117)
BUN: 14 mg/dL (ref 6–23)
CO2: 29 mEq/L (ref 19–32)
Calcium: 9.4 mg/dL (ref 8.4–10.5)
Chloride: 104 mEq/L (ref 96–112)
Creatinine, Ser: 1.02 mg/dL (ref 0.40–1.50)
GFR: 81.54 mL/min (ref >60.00)
Glucose, Bld: 105 mg/dL — ABNORMAL HIGH (ref 70–99)
Potassium: 4.2 mEq/L (ref 3.5–5.1)
Sodium: 139 mEq/L (ref 135–145)
Total Bilirubin: 0.5 mg/dL (ref 0.2–1.2)
Total Protein: 7.1 g/dL (ref 6.0–8.3)

## 2019-09-07 LAB — CBC
HCT: 45.2 % (ref 39.0–52.0)
Hemoglobin: 15.4 g/dL (ref 13.0–17.0)
MCHC: 34 g/dL (ref 30.0–36.0)
MCV: 86.7 fl (ref 78.0–100.0)
Platelets: 220 10*3/uL (ref 150.0–400.0)
RBC: 5.21 Mil/uL (ref 4.22–5.81)
RDW: 13.3 % (ref 11.5–15.5)
WBC: 6.6 10*3/uL (ref 4.0–10.5)

## 2019-09-07 NOTE — Assessment & Plan Note (Signed)
Better with the pepcid

## 2019-09-07 NOTE — Progress Notes (Signed)
Subjective:    Patient ID: Elijah Logan, male    DOB: 07-19-1980, 39 y.o.   MRN: [id number]  HPI Here for physical This visit occurred during the SARS-CoV-2 public health emergency.  Safety protocols were in place, including screening questions prior to the visit, additional usage of staff PPE, and extensive cleaning of exam room while observing appropriate contact time as indicated for disinfecting solutions.   Work still tough Has gotten some help Plans to pay off house next year--will give him some flexibility in his job Has simplified parts of his budget to see if they can survive on less Got tired with the lorazepam--hasn't taken it  Has tried to cut back on eating Weight down slightly Sticking to mostly water Tries to walk a little--but not really exercise  Current Outpatient Medications on File Prior to Visit  Medication Sig Dispense Refill  . cyclobenzaprine (FLEXERIL) 10 MG tablet Take 0.5-1 tablets (5-10 mg total) by mouth 3 times/day as needed-between meals & bedtime for muscle spasms. 15 tablet 0  . famotidine (PEPCID) 20 MG tablet Take 20 mg by mouth daily as needed for heartburn or indigestion.    . fluticasone (FLONASE) 50 MCG/ACT nasal spray Place 2 sprays into both nostrils as needed.     . hyoscyamine (LEVSIN SL) 0.125 MG SL tablet Place 1 tablet (0.125 mg total) every 4 (four) hours as needed under the tongue. 60 tablet 3  . ibuprofen (MOTRIN IB) 200 MG tablet Take 3 tablets (600 mg total) by mouth every 6 (six) hours as needed. 20 tablet 0  . loratadine (CLARITIN) 10 MG tablet Take 10 mg by mouth daily.     No current facility-administered medications on file prior to visit.    No Known Allergies  Past Medical History:  Diagnosis Date  . Allergic rhinitis due to pollen    mild  . Bulging lumbar disc   . Chronic low back pain    Has spina bifida occulta  . Heart palpitations    PVCs noted on Event Monitor - some in Bigeminy or couplets.  . History of  kidney stones   . Hyperlipidemia   . Moderate obesity   . OSA on CPAP     Past Surgical History:  Procedure Laterality Date  . OPEN ANTERIOR SHOULDER RECONSTRUCTION  1998   Right  . TRANSTHORACIC ECHOCARDIOGRAM  12/28/2013   EF 55-60%.  Normal diastolic function  . TREADMILL STRESS ECHO  01/02/2014   Ex 10:31 Min; 12.5 METS --> no EKG changes, Normal pre & post Echo Wall Motion. -- No ischemia or infarction    Family History  Problem Relation Age of Onset  . Diabetes Mother        borderline  . Diabetes Father        borderline  . Heart disease Father        MI and needed defibrillation; PCI x3  . Heart attack Father   . Hypertension Other   . Stroke Other   . Cancer Other        prostate cancer  . Diabetes Maternal Grandmother   . Diabetes Paternal Grandmother   . Prostate cancer Other   . Kidney cancer Neg Hx   . Bladder Cancer Neg Hx     Social History   Socioeconomic History  . Marital status: Married    Spouse name: Not on file  . Number of children: 2  . Years of education: Not on file  . Highest education level:  Not on file  Occupational History  . 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 Smoking status: Never Smoker  . Smokeless tobacco: Never Used  Substance and Sexual Activity  . Alcohol use: Yes    Comment: 0-1week  . Drug use: No  . Sexual activity: Not on file  Other Topics Concern  . Not on file  Social History Narrative      He currently works for [employer] in [place]. His wife is a Designer, jewellery at [employer] Strain:   . Difficulty of Paying Living Expenses:   Food Insecurity:   . Worried About Charity fundraiser in the Last Year:   . Arboriculturist in the Last Year:   Transportation Needs:   . Film/video editor (Medical):   Marland Kitchen Lack of Transportation (Non-Medical):   Physical  Activity:   . Days of Exercise per Week:   . Minutes of Exercise per Session:   Stress:   . Feeling of Stress :   Social Connections:   . Frequency of Communication with Friends and Family:   . Frequency of Social Gatherings with Friends and Family:   . Attends Religious Services:   . Active Member of Clubs or Organizations:   . Attends Archivist Meetings:   Marland Kitchen Marital Status:   Intimate Partner Violence:   . Fear of Current or Ex-Partner:   . Emotionally Abused:   Marland Kitchen Physically Abused:   . Sexually Abused:    Review of Systems  Constitutional: Negative for fatigue.       Wears seat belt  HENT: Negative for dental problem, hearing loss, tinnitus and trouble swallowing.        Keeps up with dentist  Eyes: Negative for visual disturbance.       No diplopia or unilateral vision loss  Respiratory:       Slight cough with congestion (?allergies) Uses albuterol rarely  Cardiovascular: Negative for chest pain, palpitations and leg swelling.  Gastrointestinal: Negative for blood in stool.       Still bothered with stomach--when thinking about work (at night and first thing in the AM), then will move bowels----gone over the weekend Acid symptoms better--on the famotidine  Endocrine: Negative for polydipsia and polyuria.  Genitourinary: Negative for difficulty urinating and urgency.       No sexual problems  Musculoskeletal: Positive for back pain. Negative for arthralgias and joint swelling.       Uses flexeril prn---rarely  Skin: Negative for rash.       Has a couple of moles to be checked  Allergic/Immunologic: Positive for environmental allergies. Negative for immunocompromised state.       Claritin/flonase prn  Neurological: Positive for headaches. Negative for dizziness, syncope and light-headedness.  Hematological: Negative for adenopathy. Does not bruise/bleed easily.  Psychiatric/Behavioral: Negative for dysphoric mood. The patient is nervous/anxious.         Sleeping better lately       Objective:   Physical Exam  Constitutional: He is oriented to person, place, and time. He appears well-developed. No distress.  HENT:  Head: Normocephalic and atraumatic.  Right Ear: External ear normal.  Left Ear: External ear normal.  Mouth/Throat: Oropharynx is clear and moist. No oropharyngeal exudate.  Eyes: Pupils are equal, round, and reactive to light. Conjunctivae are normal.  Neck:  No thyromegaly present.  Cardiovascular: Normal rate, regular rhythm, normal heart sounds and intact distal pulses. Exam reveals no gallop.  No murmur heard. Respiratory: Effort normal and breath sounds normal. No respiratory distress. He has no wheezes. He has no rales.  GI: Soft. There is no abdominal tenderness.  Musculoskeletal:        General: No tenderness or edema.  Lymphadenopathy:    He has no cervical adenopathy.  Neurological: He is alert and oriented to person, place, and time.  Skin: No rash noted.  Lots of nevi (known suspicious) --advised derm follow up           Assessment & Plan:

## 2019-09-07 NOTE — Assessment & Plan Note (Signed)
Chronic but not severe

## 2019-09-07 NOTE — Assessment & Plan Note (Signed)
Healthy but obese Opposed to COVID vaccine (he had it)---recommended Flu vaccine in fall

## 2019-09-07 NOTE — Assessment & Plan Note (Signed)
Will check labs Discussed graded slow weight loss

## 2019-09-17 IMAGING — MR MR SHOULDER*R* W/CM
5 series · 40 of 40 positions shown · IV contrast (agent unspecified)
Comparison: None.

CLINICAL DATA: Shoulder dislocation 3 weeks ago.

EXAM:
MR ARTHROGRAM OF THE right SHOULDER
TECHNIQUE: Multiplanar, multisequence MR imaging of the right shoulder was
performed following the administration of intra-articular contrast.
CONTRAST:  See Injection Documentation.

[Series 5: T1 fat-sat · axial · 4.0mm · 0.23mm/px · z∈[+10,+82]mm · 8 of 18 slices shown (1 of 3)]
[im 1/18]
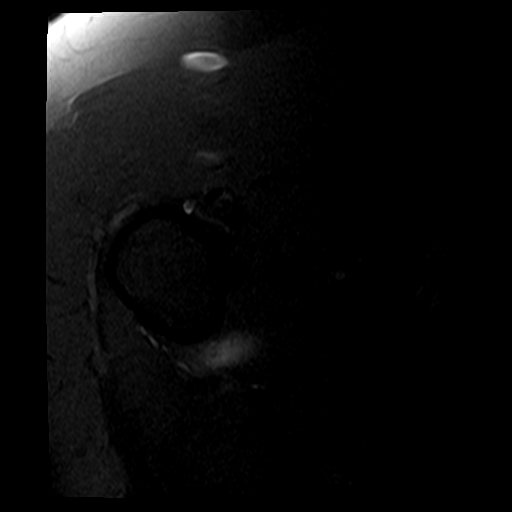
[im 3/18]
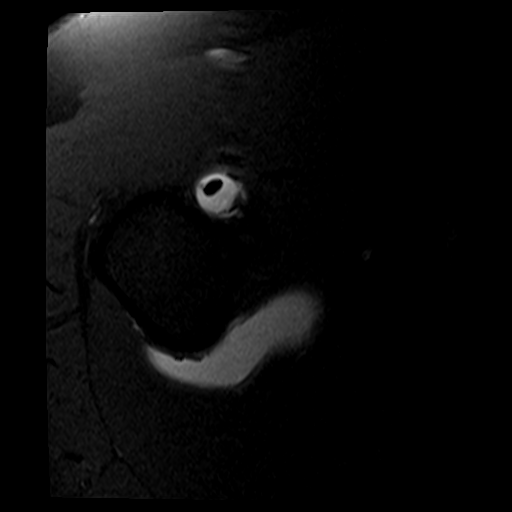
[im 5/18]
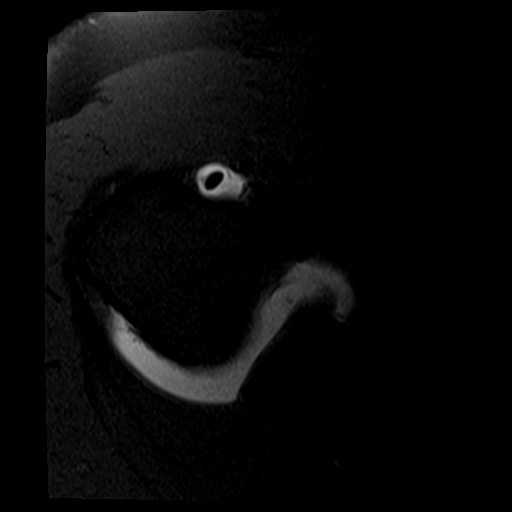
[im 8/18]
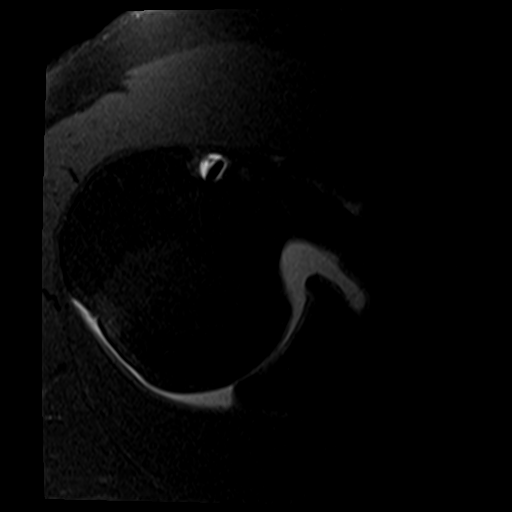
[im 10/18]
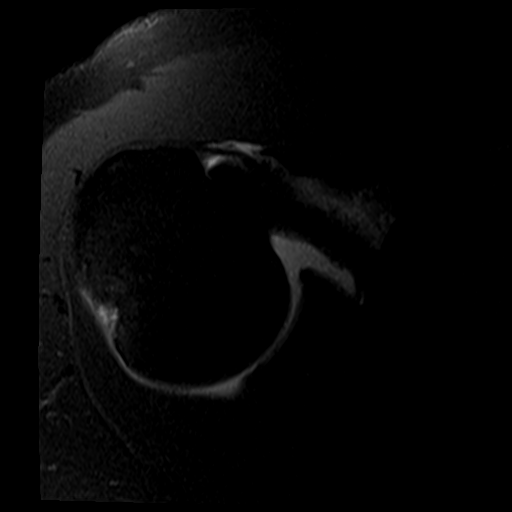
[im 13/18]
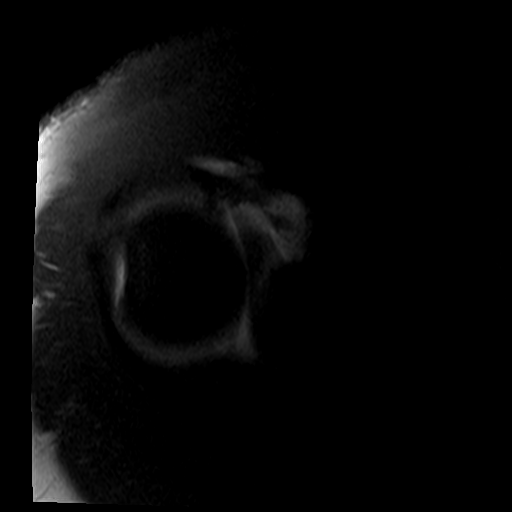
[im 15/18]
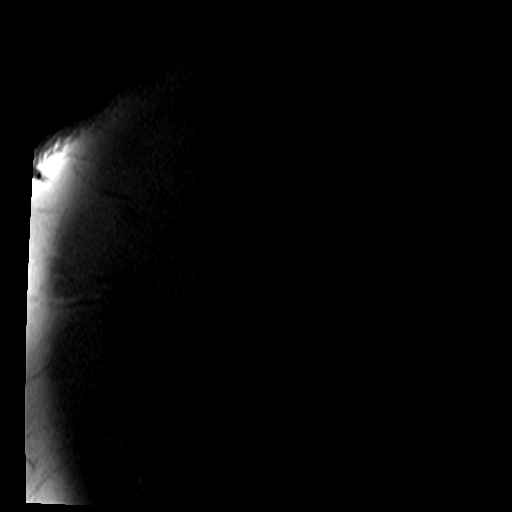
[im 18/18]
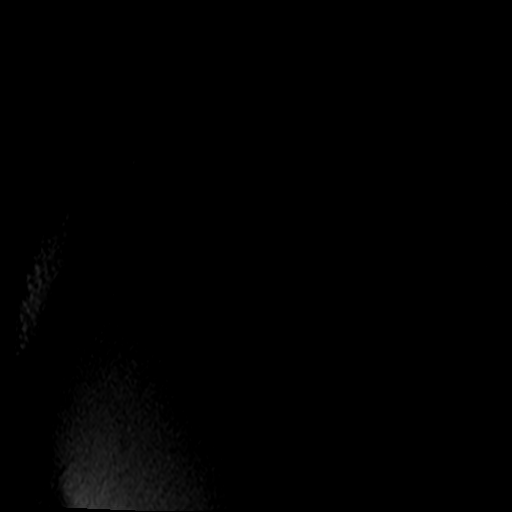

[Series 6: T2 fat-sat · oblique · 4.0mm · 0.55mm/px · 8 of 17 slices shown (1 of 2)]
[im 1/17]
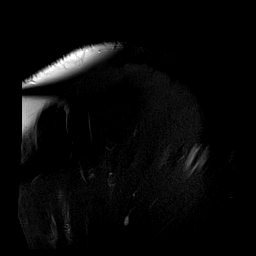
[im 3/17]
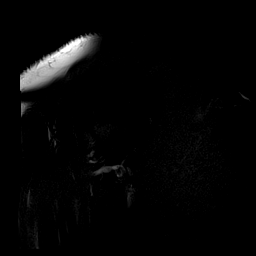
[im 5/17]
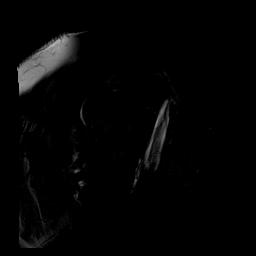
[im 7/17]
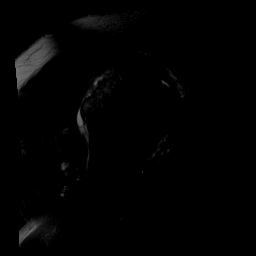
[im 10/17]
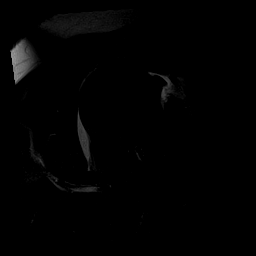
[im 12/17]
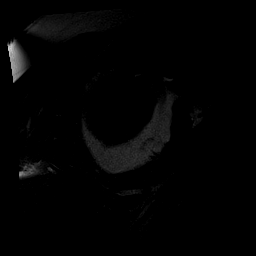
[im 14/17]
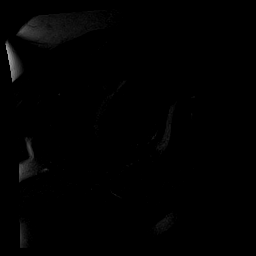
[im 17/17]
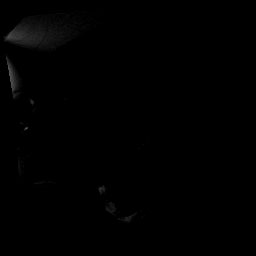

[Series 7: T1 fat-sat · oblique · 4.0mm · 0.44mm/px · 8 of 16 slices shown (2 of 3)]
[im 1/16]
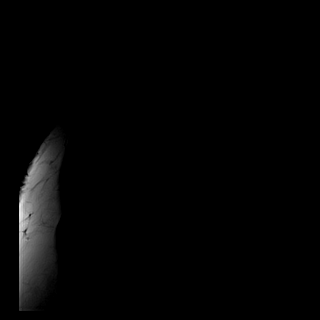
[im 3/16]
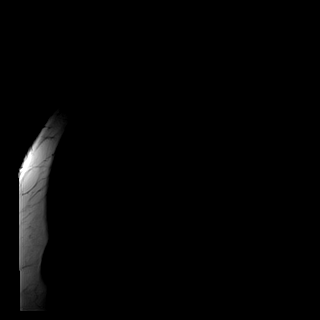
[im 5/16]
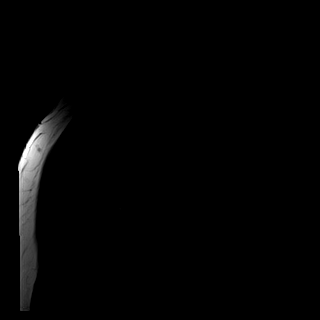
[im 7/16]
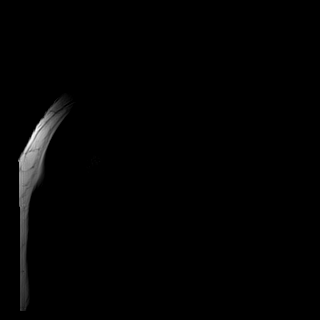
[im 9/16]
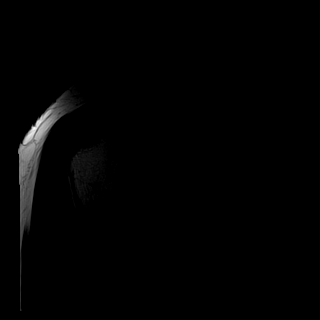
[im 11/16]
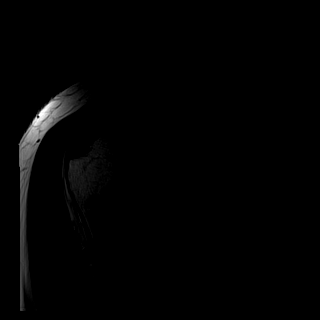
[im 13/16]
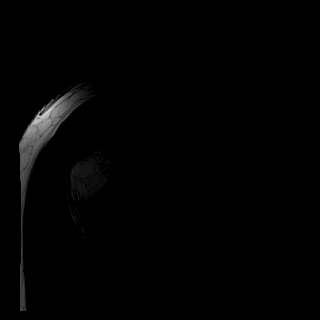
[im 16/16]
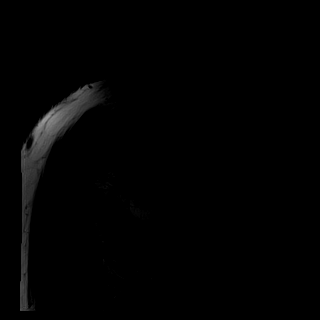

[Series 8: T1 fat-sat · oblique · 4.0mm · 0.55mm/px · 8 of 16 slices shown (3 of 3)]
[im 1/16]
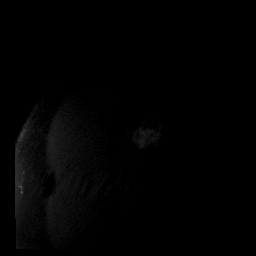
[im 3/16]
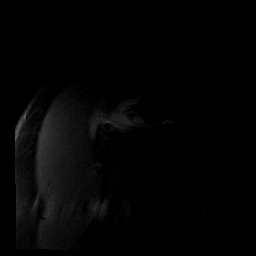
[im 5/16]
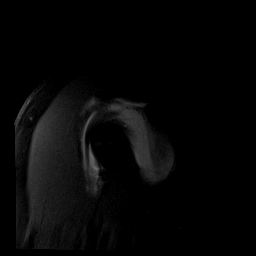
[im 7/16]
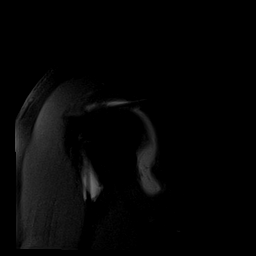
[im 9/16]
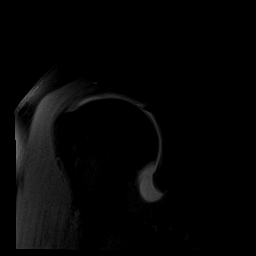
[im 11/16]
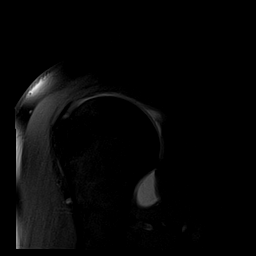
[im 13/16]
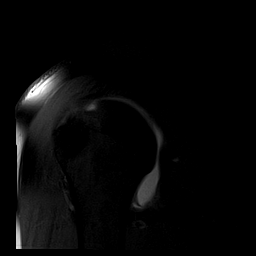
[im 16/16]
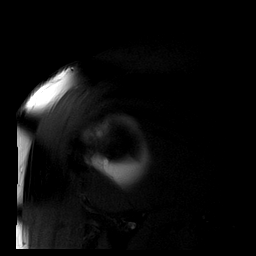

[Series 9: T2 fat-sat · oblique · 4.0mm · 0.55mm/px · 8 of 16 slices shown (2 of 2)]
[im 1/16]
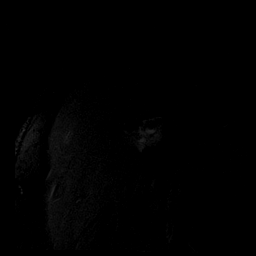
[im 3/16]
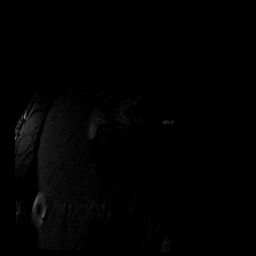
[im 5/16]
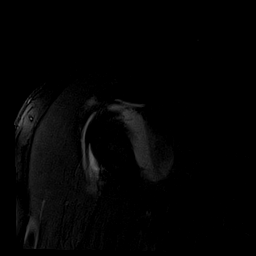
[im 7/16]
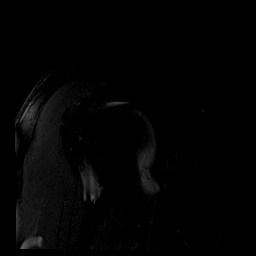
[im 9/16]
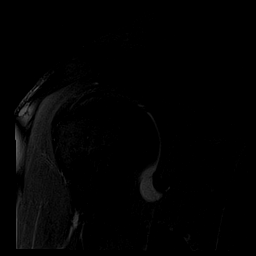
[im 11/16]
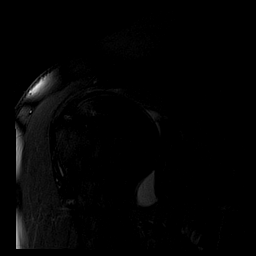
[im 13/16]
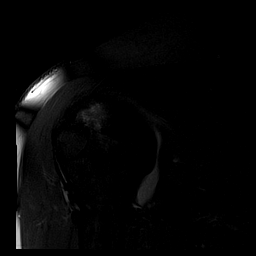
[im 16/16]
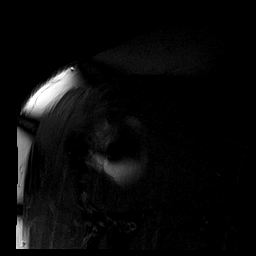

[40 of 40 positions shown; findings below may reference images not displayed]

FINDINGS: Examination is somewhat limited by patient motion.

Rotator cuff: Mild rotator cuff tendinopathy/tendinosis but no
partial or full-thickness tear is identified.

Muscles: Normal

Biceps long head: Intact

Acromioclavicular Joint: No significant degenerative changes.

Glenohumeral Joint: Normal appearing articular cartilage. No loose
bodies.

Labrum: Anterior inferior labral tear. No obvious bony Bankart
injury.

Bones: Hill-Sachs impaction type fracture involving the humeral
head. No definite bony Bankart lesion.
IMPRESSION: 1. Anterior inferior labral tear without definite bony Bankart
injury.
2. Hill-Sachs impaction type fracture involving the humeral head
posteriorly.
3. Intact rotator cuff tendons and long head biceps tendon.

## 2019-11-06 DIAGNOSIS — G4733 Obstructive sleep apnea (adult) (pediatric): Secondary | ICD-10-CM | POA: Diagnosis not present

## 2020-08-05 DIAGNOSIS — G4733 Obstructive sleep apnea (adult) (pediatric): Secondary | ICD-10-CM | POA: Diagnosis not present

## 2020-09-01 ENCOUNTER — Telehealth: Payer: Self-pay

## 2020-09-01 NOTE — Telephone Encounter (Signed)
[facility] Night - Client Nonclinical Telephone Record AccessNurse Client [facility] Night - Client Client Site [place] Physician Viviana Simpler- MD Contact Type Call Who Is Calling Patient / Member / Family / Caregiver Caller Name [place] Phone Number [phone] Patient Name [name] Patient DOB 10-May-1980 Call Type Message Only Information Provided Reason for Call Request for General Office Information Initial Comment Caller is requesting the date of his last physician. Additional Comment Provided caller with office hours and advised to call back during business hours. Disp. Time Disposition Final User 09/01/2020 7:40:56 AM General Information Provided Yes Pense, Lori Call Closed By: Gloris Ham Transaction Date/Time: 09/01/2020 7:39:30 AM (ET)

## 2020-09-04 NOTE — Telephone Encounter (Signed)
Lvm asking pt to call office to discuss. 

## 2020-09-12 ENCOUNTER — Encounter: Payer: BC Managed Care – PPO | Admitting: Internal Medicine

## 2020-09-16 ENCOUNTER — Ambulatory Visit (INDEPENDENT_AMBULATORY_CARE_PROVIDER_SITE_OTHER): Payer: BC Managed Care – PPO | Admitting: Internal Medicine

## 2020-09-16 ENCOUNTER — Other Ambulatory Visit: Payer: Self-pay

## 2020-09-16 ENCOUNTER — Encounter: Payer: Self-pay | Admitting: Internal Medicine

## 2020-09-16 VITALS — BP 122/86 | HR 69 | Temp 97.5°F | Ht 70.0 in | Wt 339.0 lb

## 2020-09-16 DIAGNOSIS — K219 Gastro-esophageal reflux disease without esophagitis: Secondary | ICD-10-CM

## 2020-09-16 DIAGNOSIS — Z0000 Encounter for general adult medical examination without abnormal findings: Secondary | ICD-10-CM | POA: Diagnosis not present

## 2020-09-16 LAB — LIPID PANEL
Cholesterol: 197 mg/dL (ref 0–200)
HDL: 40.2 mg/dL (ref >39.00)
LDL Cholesterol: 128 mg/dL — ABNORMAL HIGH (ref 0–99)
NonHDL: 156.47
Total CHOL/HDL Ratio: 5
Triglycerides: 141 mg/dL (ref 0.0–149.0)
VLDL: 28.2 mg/dL (ref 0.0–40.0)

## 2020-09-16 LAB — COMPREHENSIVE METABOLIC PANEL
ALT: 35 U/L (ref 0–53)
AST: 20 U/L (ref 0–37)
Albumin: 4.5 g/dL (ref 3.5–5.2)
Alkaline Phosphatase: 58 U/L (ref 39–117)
BUN: 13 mg/dL (ref 6–23)
CO2: 28 mEq/L (ref 19–32)
Calcium: 9 mg/dL (ref 8.4–10.5)
Chloride: 104 mEq/L (ref 96–112)
Creatinine, Ser: 0.89 mg/dL (ref 0.40–1.50)
GFR: 107.97 mL/min (ref >60.00)
Glucose, Bld: 94 mg/dL (ref 70–99)
Potassium: 4.2 mEq/L (ref 3.5–5.1)
Sodium: 139 mEq/L (ref 135–145)
Total Bilirubin: 0.6 mg/dL (ref 0.2–1.2)
Total Protein: 6.8 g/dL (ref 6.0–8.3)

## 2020-09-16 LAB — CBC
HCT: 44.1 % (ref 39.0–52.0)
Hemoglobin: 14.8 g/dL (ref 13.0–17.0)
MCHC: 33.6 g/dL (ref 30.0–36.0)
MCV: 87.2 fl (ref 78.0–100.0)
Platelets: 222 10*3/uL (ref 150.0–400.0)
RBC: 5.06 Mil/uL (ref 4.22–5.81)
RDW: 13.6 % (ref 11.5–15.5)
WBC: 7.3 10*3/uL (ref 4.0–10.5)

## 2020-09-16 LAB — HEMOGLOBIN A1C: Hgb A1c MFr Bld: 5.9 % (ref 4.6–6.5)

## 2020-09-16 NOTE — Assessment & Plan Note (Signed)
With mildly elevated sugar Discussed semaglutide Cut out sweet tea

## 2020-09-16 NOTE — Patient Instructions (Signed)
Please consider whether you would want to try weekly semaglutide injections to help your obesity.

## 2020-09-16 NOTE — Assessment & Plan Note (Signed)
Does okay with prn famotidine

## 2020-09-16 NOTE — Assessment & Plan Note (Signed)
Healthy but obese Recommended COVID vaccine--he is still reluctant Flu vaccine in the fall No cancer screening yet

## 2020-09-16 NOTE — Progress Notes (Signed)
Subjective:    Patient ID: Elijah Logan, male    DOB: Mar 04, 1981, 40 y.o.   MRN: [id number]  HPI Here for physical This visit occurred during the SARS-CoV-2 public health emergency.  Safety protocols were in place, including screening questions prior to the visit, additional usage of staff PPE, and extensive cleaning of exam room while observing appropriate contact time as indicated for disinfecting solutions.   Concerned about his weight Admits to lots of sweet tea Limited exercise---mostly just yard work on weekend Long hours---but no more stress than usual  Known disc disease --L4-5 Uses flexeril rarely  Current Outpatient Medications on File Prior to Visit  Medication Sig Dispense Refill  . cyclobenzaprine (FLEXERIL) 10 MG tablet Take 0.5-1 tablets (5-10 mg total) by mouth 3 times/day as needed-between meals & bedtime for muscle spasms. 15 tablet 0  . famotidine (PEPCID) 20 MG tablet Take 20 mg by mouth daily as needed for heartburn or indigestion.    . fluticasone (FLONASE) 50 MCG/ACT nasal spray Place 2 sprays into both nostrils as needed.     . hyoscyamine (LEVSIN SL) 0.125 MG SL tablet Place 1 tablet (0.125 mg total) every 4 (four) hours as needed under the tongue. 60 tablet 3  . ibuprofen (MOTRIN IB) 200 MG tablet Take 3 tablets (600 mg total) by mouth every 6 (six) hours as needed. 20 tablet 0  . loratadine (CLARITIN) 10 MG tablet Take 10 mg by mouth daily.     No current facility-administered medications on file prior to visit.    No Known Allergies  Past Medical History:  Diagnosis Date  . Allergic rhinitis due to pollen    mild  . Bulging lumbar disc   . Chronic low back pain    Has spina bifida occulta  . Heart palpitations    PVCs noted on Event Monitor - some in Bigeminy or couplets.  . History of kidney stones   . Hyperlipidemia   . Moderate obesity   . OSA on CPAP     Past Surgical History:  Procedure Laterality Date  . OPEN ANTERIOR SHOULDER  RECONSTRUCTION  1998   Right  . TRANSTHORACIC ECHOCARDIOGRAM  12/28/2013   EF 55-60%.  Normal diastolic function  . TREADMILL STRESS ECHO  01/02/2014   Ex 10:31 Min; 12.5 METS --> no EKG changes, Normal pre & post Echo Wall Motion. -- No ischemia or infarction    Family History  Problem Relation Age of Onset  . Diabetes Mother        borderline  . Diabetes Father        borderline  . Heart disease Father        MI and needed defibrillation; PCI x3  . Heart attack Father   . Hypertension Other   . Stroke Other   . Cancer Other        prostate cancer  . Diabetes Maternal Grandmother   . Diabetes Paternal Grandmother   . Prostate cancer Other   . Kidney cancer Neg Hx   . Bladder Cancer Neg Hx     Social History   Socioeconomic History  . Marital status: Married    Spouse name: Not on file  . Number of children: 2  . Years of education: Not on file  . Highest education level: Not on file  Occupational History  . 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 Smoking  status: Never Smoker  . Smokeless tobacco: Never Used  Substance and Sexual Activity  . Alcohol use: Yes    Comment: 0-1week  . Drug use: No  . Sexual activity: Not on file  Other Topics Concern  . Not on file  Social History Narrative      He currently works for [employer] in [place]. His wife is a Designer, jewellery at [employer] Strain: Not on [employer] Insecurity: Not on file  Transportation Needs: Not on file  Physical Activity: Not on file  Stress: Not on file  Social Connections: Not on file  Intimate Partner Violence: Not on file   Review of Systems  Constitutional: Negative for fatigue.       Has put on a lot of weight Wear seat belt  HENT: Negative for dental problem, hearing loss, tinnitus and trouble swallowing.        Keeps up with dentist  Eyes:  Negative for visual disturbance.       No diplopia or unilateral vision loss  Respiratory: Negative for cough and chest tightness.        Mild DOE since second COVID infection in January  Cardiovascular: Negative for chest pain, palpitations and leg swelling.  Gastrointestinal: Negative for blood in stool and constipation.       Occ red blood on toilet paper--not in stool Uses the pepcid occasionally  Endocrine: Negative for polydipsia and polyuria.  Genitourinary: Negative for difficulty urinating and urgency.       No sexual problems  Musculoskeletal: Positive for back pain. Negative for joint swelling.       Occasional left ankle pain  Skin: Negative for rash.  Allergic/Immunologic: Positive for environmental allergies. Negative for immunocompromised state.       Claritin helps  Neurological: Negative for syncope and light-headedness.       May get slight brief dizziness if he jumps up quickly (towards the end of the day) Occ migraines--will rest (or tylenol)  Hematological: Negative for adenopathy. Does not bruise/bleed easily.  Psychiatric/Behavioral: Negative for dysphoric mood and sleep disturbance.       Sleeps with CPAP nightly Work related anxiety       Objective:   Physical Exam Constitutional:      Appearance: He is obese.  HENT:     Right Ear: Tympanic membrane, ear canal and external ear normal.     Left Ear: Tympanic membrane, ear canal and external ear normal.     Mouth/Throat:     Pharynx: No oropharyngeal exudate or posterior oropharyngeal erythema.  Eyes:     Conjunctiva/sclera: Conjunctivae normal.     Pupils: Pupils are equal, round, and reactive to light.  Cardiovascular:     Rate and Rhythm: Normal rate and regular rhythm.     Pulses: Normal pulses.     Heart sounds: No murmur heard. No gallop.   Pulmonary:     Effort: Pulmonary effort is normal.     Breath sounds: Normal breath sounds. No wheezing or rales.  Abdominal:     Palpations: Abdomen is  soft.     Tenderness: There is no abdominal tenderness.  Musculoskeletal:     Cervical back: Neck supple.     Right lower leg: No edema.     Left lower leg: No edema.  Lymphadenopathy:     Cervical: No cervical adenopathy.  Skin:    General: Skin is warm.     Findings:  No rash.     Comments: Multiple benign nevi--especially on back  Neurological:     General: No focal deficit present.     Mental Status: He is alert and oriented to person, place, and time.  Psychiatric:        Mood and Affect: Mood normal.        Behavior: Behavior normal.            Assessment & Plan:

## 2020-11-28 DIAGNOSIS — M9902 Segmental and somatic dysfunction of thoracic region: Secondary | ICD-10-CM | POA: Diagnosis not present

## 2020-11-28 DIAGNOSIS — M9903 Segmental and somatic dysfunction of lumbar region: Secondary | ICD-10-CM | POA: Diagnosis not present

## 2020-11-28 DIAGNOSIS — M9901 Segmental and somatic dysfunction of cervical region: Secondary | ICD-10-CM | POA: Diagnosis not present

## 2020-11-28 DIAGNOSIS — M542 Cervicalgia: Secondary | ICD-10-CM | POA: Diagnosis not present

## 2020-12-05 DIAGNOSIS — M9901 Segmental and somatic dysfunction of cervical region: Secondary | ICD-10-CM | POA: Diagnosis not present

## 2020-12-05 DIAGNOSIS — M542 Cervicalgia: Secondary | ICD-10-CM | POA: Diagnosis not present

## 2020-12-05 DIAGNOSIS — M9902 Segmental and somatic dysfunction of thoracic region: Secondary | ICD-10-CM | POA: Diagnosis not present

## 2020-12-05 DIAGNOSIS — M9903 Segmental and somatic dysfunction of lumbar region: Secondary | ICD-10-CM | POA: Diagnosis not present

## 2020-12-09 DIAGNOSIS — M9902 Segmental and somatic dysfunction of thoracic region: Secondary | ICD-10-CM | POA: Diagnosis not present

## 2020-12-09 DIAGNOSIS — M9903 Segmental and somatic dysfunction of lumbar region: Secondary | ICD-10-CM | POA: Diagnosis not present

## 2020-12-09 DIAGNOSIS — M9901 Segmental and somatic dysfunction of cervical region: Secondary | ICD-10-CM | POA: Diagnosis not present

## 2020-12-09 DIAGNOSIS — M542 Cervicalgia: Secondary | ICD-10-CM | POA: Diagnosis not present

## 2020-12-11 DIAGNOSIS — M9902 Segmental and somatic dysfunction of thoracic region: Secondary | ICD-10-CM | POA: Diagnosis not present

## 2020-12-11 DIAGNOSIS — M542 Cervicalgia: Secondary | ICD-10-CM | POA: Diagnosis not present

## 2020-12-11 DIAGNOSIS — M9903 Segmental and somatic dysfunction of lumbar region: Secondary | ICD-10-CM | POA: Diagnosis not present

## 2020-12-11 DIAGNOSIS — M9901 Segmental and somatic dysfunction of cervical region: Secondary | ICD-10-CM | POA: Diagnosis not present

## 2020-12-15 DIAGNOSIS — M9901 Segmental and somatic dysfunction of cervical region: Secondary | ICD-10-CM | POA: Diagnosis not present

## 2020-12-15 DIAGNOSIS — M542 Cervicalgia: Secondary | ICD-10-CM | POA: Diagnosis not present

## 2020-12-15 DIAGNOSIS — M9903 Segmental and somatic dysfunction of lumbar region: Secondary | ICD-10-CM | POA: Diagnosis not present

## 2020-12-15 DIAGNOSIS — M9902 Segmental and somatic dysfunction of thoracic region: Secondary | ICD-10-CM | POA: Diagnosis not present

## 2020-12-18 DIAGNOSIS — M9901 Segmental and somatic dysfunction of cervical region: Secondary | ICD-10-CM | POA: Diagnosis not present

## 2020-12-18 DIAGNOSIS — M9903 Segmental and somatic dysfunction of lumbar region: Secondary | ICD-10-CM | POA: Diagnosis not present

## 2020-12-18 DIAGNOSIS — M542 Cervicalgia: Secondary | ICD-10-CM | POA: Diagnosis not present

## 2020-12-18 DIAGNOSIS — M9902 Segmental and somatic dysfunction of thoracic region: Secondary | ICD-10-CM | POA: Diagnosis not present

## 2020-12-19 DIAGNOSIS — M9902 Segmental and somatic dysfunction of thoracic region: Secondary | ICD-10-CM | POA: Diagnosis not present

## 2020-12-19 DIAGNOSIS — M9901 Segmental and somatic dysfunction of cervical region: Secondary | ICD-10-CM | POA: Diagnosis not present

## 2020-12-19 DIAGNOSIS — M9903 Segmental and somatic dysfunction of lumbar region: Secondary | ICD-10-CM | POA: Diagnosis not present

## 2020-12-19 DIAGNOSIS — M542 Cervicalgia: Secondary | ICD-10-CM | POA: Diagnosis not present

## 2020-12-26 DIAGNOSIS — M9903 Segmental and somatic dysfunction of lumbar region: Secondary | ICD-10-CM | POA: Diagnosis not present

## 2020-12-26 DIAGNOSIS — M542 Cervicalgia: Secondary | ICD-10-CM | POA: Diagnosis not present

## 2020-12-26 DIAGNOSIS — M9902 Segmental and somatic dysfunction of thoracic region: Secondary | ICD-10-CM | POA: Diagnosis not present

## 2020-12-26 DIAGNOSIS — M9901 Segmental and somatic dysfunction of cervical region: Secondary | ICD-10-CM | POA: Diagnosis not present

## 2020-12-26 DIAGNOSIS — R1013 Epigastric pain: Secondary | ICD-10-CM | POA: Diagnosis not present

## 2020-12-29 DIAGNOSIS — M542 Cervicalgia: Secondary | ICD-10-CM | POA: Diagnosis not present

## 2020-12-29 DIAGNOSIS — M9903 Segmental and somatic dysfunction of lumbar region: Secondary | ICD-10-CM | POA: Diagnosis not present

## 2020-12-29 DIAGNOSIS — M9902 Segmental and somatic dysfunction of thoracic region: Secondary | ICD-10-CM | POA: Diagnosis not present

## 2020-12-29 DIAGNOSIS — M9901 Segmental and somatic dysfunction of cervical region: Secondary | ICD-10-CM | POA: Diagnosis not present

## 2021-01-01 DIAGNOSIS — M9901 Segmental and somatic dysfunction of cervical region: Secondary | ICD-10-CM | POA: Diagnosis not present

## 2021-01-01 DIAGNOSIS — M542 Cervicalgia: Secondary | ICD-10-CM | POA: Diagnosis not present

## 2021-01-01 DIAGNOSIS — M9902 Segmental and somatic dysfunction of thoracic region: Secondary | ICD-10-CM | POA: Diagnosis not present

## 2021-01-01 DIAGNOSIS — M9903 Segmental and somatic dysfunction of lumbar region: Secondary | ICD-10-CM | POA: Diagnosis not present

## 2021-01-06 DIAGNOSIS — M542 Cervicalgia: Secondary | ICD-10-CM | POA: Diagnosis not present

## 2021-01-06 DIAGNOSIS — M9902 Segmental and somatic dysfunction of thoracic region: Secondary | ICD-10-CM | POA: Diagnosis not present

## 2021-01-06 DIAGNOSIS — M9901 Segmental and somatic dysfunction of cervical region: Secondary | ICD-10-CM | POA: Diagnosis not present

## 2021-01-06 DIAGNOSIS — M9903 Segmental and somatic dysfunction of lumbar region: Secondary | ICD-10-CM | POA: Diagnosis not present

## 2021-01-07 ENCOUNTER — Telehealth: Payer: Self-pay

## 2021-01-07 NOTE — Telephone Encounter (Signed)
Venia Carbon, MD to Me  Pilar Grammes, CMA     1:18 PM  Okay to add him on at noon on Tuesday.  Make sure he has copies of all his reports    Pt notified as instructed and pt was appreciative and voiced understanding and will see Dr Silvio Pate on 12 noon on 01/13/21. UC & ED precautions again given and pt voiced understanding. Sending note to [facility] CMA.

## 2021-01-07 NOTE — Telephone Encounter (Signed)
[name] - Client Nonclinical Telephone Record AccessNurse Client [facility] Night - Client Client Site [place] Physician Viviana Simpler- MD Contact Type Call Who Is Calling Patient / Member / Family / Caregiver Caller Name [employer] Phone Number [phone] Patient Name Elijah Logan Patient DOB 1981/03/03 Call Type Message Only Information Provided Reason for Call Request to Schedule Office Appointment Initial Comment Caller needing an appt for next Tuesday 09/13 / caller having stomach pains and tested positive for stomach ulcer - Patient request to speak to RN No Disp. Time Disposition Final User 01/07/2021 7:23:39 AM General Information Provided Yes [name], [name] Call Closed By: Nelia Shi Transaction Date/Time: 01/07/2021 7:21:05 AM (ET)

## 2021-01-07 NOTE — Telephone Encounter (Signed)
I spoke with Elijah Logan Elijah Logan is traveling with work and last week Elijah Logan went to UC out of town(Elijah Logan will bring lab results to appt) and was advised has + h pylori; on and off Elijah Logan has upper mid sharpe to dull abd pain. Streaking of blood with diarrhea stools, last diarrhea with blood was last night and when Elijah Logan has abd pain the pain level is between 1 - 3 and now pain level is 1 - 2. Elijah Logan can only come next wk on 01/13/21.(Only day Elijah Logan will be in town next wk.) Elijah Logan was started on PPI at the [facility]. Elijah Logan is having a bloated feeling today also.  Elijah Logan does not have a fever and no other covid symptoms or exposure per Elijah Logan. Elijah Logan would like to see Dr Silvio Pate on 01/13/21. Elijah Logan request cb after reviewed by Dr Silvio Pate to see if can have work in appt on 01/13/21 with Dr Silvio Pate. Elijah Logan said if Dr Silvio Pate could not see him he would see another provider on 01/13/21.sending note to DR Silvio Pate and Larene Beach CMA. UC & ED precautions given and Elijah Logan voiced understanding.

## 2021-01-12 DIAGNOSIS — M9902 Segmental and somatic dysfunction of thoracic region: Secondary | ICD-10-CM | POA: Diagnosis not present

## 2021-01-12 DIAGNOSIS — M9901 Segmental and somatic dysfunction of cervical region: Secondary | ICD-10-CM | POA: Diagnosis not present

## 2021-01-12 DIAGNOSIS — M9903 Segmental and somatic dysfunction of lumbar region: Secondary | ICD-10-CM | POA: Diagnosis not present

## 2021-01-12 DIAGNOSIS — M542 Cervicalgia: Secondary | ICD-10-CM | POA: Diagnosis not present

## 2021-01-13 ENCOUNTER — Other Ambulatory Visit: Payer: Self-pay

## 2021-01-13 ENCOUNTER — Other Ambulatory Visit: Payer: Self-pay | Admitting: Internal Medicine

## 2021-01-13 ENCOUNTER — Encounter: Payer: Self-pay | Admitting: Internal Medicine

## 2021-01-13 ENCOUNTER — Ambulatory Visit (INDEPENDENT_AMBULATORY_CARE_PROVIDER_SITE_OTHER): Payer: BC Managed Care – PPO | Admitting: Internal Medicine

## 2021-01-13 VITALS — BP 130/84 | HR 72 | Temp 97.1°F | Ht 70.0 in | Wt 328.5 lb

## 2021-01-13 DIAGNOSIS — R1013 Epigastric pain: Secondary | ICD-10-CM | POA: Insufficient documentation

## 2021-01-13 DIAGNOSIS — K625 Hemorrhage of anus and rectum: Secondary | ICD-10-CM | POA: Diagnosis not present

## 2021-01-13 MED ORDER — PANTOPRAZOLE SODIUM 40 MG PO TBEC: 40.0000 mg | DELAYED_RELEASE_TABLET | Freq: Two times a day (BID) | ORAL | 3 refills | Status: DC

## 2021-01-13 NOTE — Assessment & Plan Note (Signed)
Had severe spell a few weeks ago Was told he had H pylori based on serology (only IgA elevated but not IgG) No severe spells since on protonix and pepcid Could be gastritis--but could be reflux related Also cholecystitis is possible --or stone  Will check ultrasound Change protonix to 40 bid for now---stop pepcid and probiotic Check H pylori stool antigen

## 2021-01-13 NOTE — Assessment & Plan Note (Signed)
Streaking in stool Currently stool is heme negative No action on this--but will proceed with GI referral

## 2021-01-13 NOTE — Patient Instructions (Signed)
Stop all the medications except the twice a day pantoprazole.

## 2021-01-13 NOTE — Progress Notes (Signed)
Subjective:    Patient ID: Elijah Logan, male    DOB: 16-Mar-1981, 40 y.o.   MRN: [id number]  HPI Here due to abdominal pain This visit occurred during the SARS-CoV-2 public health emergency.  Safety protocols were in place, including screening questions prior to the visit, additional usage of staff PPE, and extensive cleaning of exam room while observing appropriate contact time as indicated for disinfecting solutions.   Has had some sense of discomfort after eating at times Generally would be better after passing gas or moving bowels  Then 3-4 weeks ago, travelling in [place] Had a big meal--then awoke several hours later with "a burning in my stomach" Couldn't get comfortable--paced, talked to wife Someone got him pepcid/antacids Had not be taking pepcid regularly at that point Was able to stay in [place] and finish his work  Seen at urgent care here Got blood test for H pylori----IGA antibody 23.5 (high) but IgG normal (0.12) Met C and CBC were normal Started on protonix daily for slightly over a week Also 2 pepcid bid and probiotics Didn't fill the antibiotics--insurance issues  Bowels have been regular---periodic loose stools--but normal stool daily in general Started noticing blood with a firm stool---in the stool (streaked)  Did have recurrence of pain a week ago That burning again--eased off after an hour This was after a stressful time at work  Current Outpatient Medications on File Prior to Visit  Medication Sig Dispense Refill   cyclobenzaprine (FLEXERIL) 10 MG tablet Take 0.5-1 tablets (5-10 mg total) by mouth 3 times/day as needed-between meals & bedtime for muscle spasms. 15 tablet 0   famotidine (PEPCID) 20 MG tablet Take 20 mg by mouth daily as needed for heartburn or indigestion.     fluticasone (FLONASE) 50 MCG/ACT nasal spray Place 2 sprays into both nostrils as needed.      hyoscyamine (LEVSIN SL) 0.125 MG SL tablet Place 1 tablet (0.125 mg total) every 4  (four) hours as needed under the tongue. 60 tablet 3   ibuprofen (MOTRIN IB) 200 MG tablet Take 3 tablets (600 mg total) by mouth every 6 (six) hours as needed. 20 tablet 0   loratadine (CLARITIN) 10 MG tablet Take 10 mg by mouth daily.     No current facility-administered medications on file prior to visit.    No Known Allergies  Past Medical History:  Diagnosis Date   Allergic rhinitis due to pollen    mild   Bulging lumbar disc    Chronic low back pain    Has spina bifida occulta   Heart palpitations    PVCs noted on Event Monitor - some in Bigeminy or couplets.   History of kidney stones    Hyperlipidemia    Moderate obesity    OSA on CPAP     Past Surgical History:  Procedure Laterality Date   OPEN ANTERIOR SHOULDER RECONSTRUCTION  1998   Right   TRANSTHORACIC ECHOCARDIOGRAM  12/28/2013   EF 55-60%.  Normal diastolic function   TREADMILL STRESS ECHO  01/02/2014   Ex 10:31 Min; 12.5 METS --> no EKG changes, Normal pre & post Echo Wall Motion. -- No ischemia or infarction    Family History  Problem Relation Age of Onset   Diabetes Mother        borderline   Diabetes Father        borderline   Heart disease Father        MI and needed defibrillation; PCI x3   Heart  attack Father    Hypertension Other    Stroke Other    Cancer Other        prostate cancer   Diabetes Maternal Grandmother    Diabetes Paternal Grandmother    Prostate cancer Other    Kidney cancer Neg Hx    Bladder Cancer Neg Hx     Social History   Socioeconomic History   Marital status: Married    Spouse name: Not on file   Number of children: 2   Years of education: Not on file   Highest education level: Not on file  Occupational History   Occupation: Radio broadcast assistant    Comment: [employer]---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 His wife is a Designer, jewellery at [employer] Strain: Not on file  Food Insecurity: Not on file  Transportation Needs: Not on file  Physical Activity: Not on file  Stress: Not on file  Social Connections: Not on file  Intimate Partner Violence: Not on file   Review of Systems Has been trying to eat better---has lost 10 # or so (trying) Does have some sense of stomach being taut (like when bending over)     Objective:   Physical Exam Constitutional:      Appearance: He is well-developed.  Cardiovascular:     Rate and Rhythm: Normal rate and regular rhythm.     Heart sounds: No murmur heard.   No gallop.  Pulmonary:     Effort: Pulmonary effort is normal.     Breath sounds: Normal breath sounds. No wheezing or rales.  Abdominal:     Palpations: Abdomen is soft.     Tenderness: There is no guarding or rebound.     Comments: Mild epigastric tenderness  Genitourinary:    Comments: Small dilation around rectum--but no clear hemorrhoid Brown stool in vault--heme negative No masses Musculoskeletal:     Cervical back: Neck supple.     Right lower leg: No edema.     Left lower leg: No edema.  Lymphadenopathy:     Cervical: No cervical adenopathy.  Neurological:     Mental Status: He is alert.           Assessment & Plan:

## 2021-01-15 LAB — HELICOBACTER PYLORI  SPECIAL ANTIGEN
MICRO NUMBER:: 12373202
SPECIMEN QUALITY: ADEQUATE

## 2021-01-15 LAB — HOUSE ACCOUNT TRACKING

## 2021-01-29 ENCOUNTER — Ambulatory Visit
Admission: RE | Admit: 2021-01-29 | Discharge: 2021-01-29 | Disposition: A | Discharge status: Home / Self Care | Payer: BC Managed Care – PPO | Source: Ambulatory Visit | Attending: Internal Medicine | Admitting: Internal Medicine

## 2021-01-29 ENCOUNTER — Other Ambulatory Visit: Payer: Self-pay

## 2021-01-29 DIAGNOSIS — R1013 Epigastric pain: Secondary | ICD-10-CM | POA: Insufficient documentation

## 2021-01-29 DIAGNOSIS — K760 Fatty (change of) liver, not elsewhere classified: Secondary | ICD-10-CM | POA: Diagnosis not present

## 2021-01-29 DIAGNOSIS — K8020 Calculus of gallbladder without cholecystitis without obstruction: Secondary | ICD-10-CM | POA: Diagnosis not present

## 2021-03-16 DIAGNOSIS — R1012 Left upper quadrant pain: Secondary | ICD-10-CM | POA: Diagnosis not present

## 2021-03-16 DIAGNOSIS — Z791 Long term (current) use of non-steroidal anti-inflammatories (NSAID): Secondary | ICD-10-CM | POA: Diagnosis not present

## 2021-03-16 DIAGNOSIS — K625 Hemorrhage of anus and rectum: Secondary | ICD-10-CM | POA: Diagnosis not present

## 2021-03-16 DIAGNOSIS — R1013 Epigastric pain: Secondary | ICD-10-CM | POA: Diagnosis not present

## 2021-06-01 ENCOUNTER — Encounter: Payer: Self-pay | Admitting: *Deleted

## 2021-06-02 ENCOUNTER — Ambulatory Visit
Admission: RE | Admit: 2021-06-02 | Discharge: 2021-06-02 | Disposition: A | Discharge status: Home / Self Care | Payer: BC Managed Care – PPO | Source: Ambulatory Visit | Attending: Gastroenterology | Admitting: Gastroenterology

## 2021-06-02 ENCOUNTER — Ambulatory Visit: Payer: BC Managed Care – PPO | Admitting: Anesthesiology

## 2021-06-02 ENCOUNTER — Encounter: Payer: Self-pay | Admitting: *Deleted

## 2021-06-02 ENCOUNTER — Encounter
Admission: RE | Disposition: A | Discharge status: Home / Self Care | Payer: Self-pay | Source: Ambulatory Visit | Attending: Gastroenterology

## 2021-06-02 DIAGNOSIS — E785 Hyperlipidemia, unspecified: Secondary | ICD-10-CM | POA: Insufficient documentation

## 2021-06-02 DIAGNOSIS — D122 Benign neoplasm of ascending colon: Secondary | ICD-10-CM | POA: Insufficient documentation

## 2021-06-02 DIAGNOSIS — K921 Melena: Secondary | ICD-10-CM | POA: Insufficient documentation

## 2021-06-02 DIAGNOSIS — K2960 Other gastritis without bleeding: Secondary | ICD-10-CM | POA: Diagnosis not present

## 2021-06-02 DIAGNOSIS — Z6841 Body Mass Index (BMI) 40.0 and over, adult: Secondary | ICD-10-CM | POA: Insufficient documentation

## 2021-06-02 DIAGNOSIS — D125 Benign neoplasm of sigmoid colon: Secondary | ICD-10-CM | POA: Diagnosis not present

## 2021-06-02 DIAGNOSIS — K219 Gastro-esophageal reflux disease without esophagitis: Secondary | ICD-10-CM | POA: Insufficient documentation

## 2021-06-02 DIAGNOSIS — R1013 Epigastric pain: Secondary | ICD-10-CM | POA: Diagnosis not present

## 2021-06-02 DIAGNOSIS — E669 Obesity, unspecified: Secondary | ICD-10-CM | POA: Diagnosis not present

## 2021-06-02 DIAGNOSIS — D123 Benign neoplasm of transverse colon: Secondary | ICD-10-CM | POA: Diagnosis not present

## 2021-06-02 DIAGNOSIS — K254 Chronic or unspecified gastric ulcer with hemorrhage: Secondary | ICD-10-CM | POA: Diagnosis not present

## 2021-06-02 DIAGNOSIS — G4733 Obstructive sleep apnea (adult) (pediatric): Secondary | ICD-10-CM | POA: Insufficient documentation

## 2021-06-02 DIAGNOSIS — K621 Rectal polyp: Secondary | ICD-10-CM | POA: Diagnosis not present

## 2021-06-02 DIAGNOSIS — K635 Polyp of colon: Secondary | ICD-10-CM | POA: Diagnosis not present

## 2021-06-02 DIAGNOSIS — D127 Benign neoplasm of rectosigmoid junction: Secondary | ICD-10-CM | POA: Diagnosis not present

## 2021-06-02 DIAGNOSIS — K259 Gastric ulcer, unspecified as acute or chronic, without hemorrhage or perforation: Secondary | ICD-10-CM | POA: Diagnosis not present

## 2021-06-02 DIAGNOSIS — K640 First degree hemorrhoids: Secondary | ICD-10-CM | POA: Diagnosis not present

## 2021-06-02 DIAGNOSIS — D120 Benign neoplasm of cecum: Secondary | ICD-10-CM | POA: Insufficient documentation

## 2021-06-02 DIAGNOSIS — K3189 Other diseases of stomach and duodenum: Secondary | ICD-10-CM | POA: Diagnosis not present

## 2021-06-02 HISTORY — PX: ESOPHAGOGASTRODUODENOSCOPY (EGD) WITH PROPOFOL: SHX5813

## 2021-06-02 HISTORY — PX: COLONOSCOPY WITH PROPOFOL: SHX5780

## 2021-06-02 SURGERY — COLONOSCOPY WITH PROPOFOL
Anesthesia: General

## 2021-06-02 MED ORDER — PROPOFOL 500 MG/50ML IV EMUL
INTRAVENOUS | Status: AC
  Filled 2021-06-02: qty 50

## 2021-06-02 MED ORDER — PROPOFOL 10 MG/ML IV BOLUS
INTRAVENOUS | Status: DC | PRN
  Administered 2021-06-02: 100 mg via INTRAVENOUS

## 2021-06-02 MED ORDER — SODIUM CHLORIDE 0.9 % IV SOLN: INTRAVENOUS | Status: DC

## 2021-06-02 MED ORDER — PROPOFOL 500 MG/50ML IV EMUL
INTRAVENOUS | Status: DC | PRN
  Administered 2021-06-02: 200 ug/kg/min via INTRAVENOUS

## 2021-06-02 NOTE — Transfer of Care (Signed)
Immediate Anesthesia Transfer of Care Note  Patient: Elijah Logan  Procedure(s) Performed: COLONOSCOPY WITH PROPOFOL ESOPHAGOGASTRODUODENOSCOPY (EGD) WITH PROPOFOL  Patient Location: PACU  Anesthesia Type:General  Level of Consciousness: sedated  Airway & Oxygen Therapy: Patient Spontanous Breathing and Patient connected to nasal cannula oxygen  Post-op Assessment: Report given to RN and Post -op Vital signs reviewed and stable  Post vital signs: Reviewed and stable  Last Vitals:  Vitals Value Taken Time  BP 97/75 06/02/21 1130  Temp    Pulse 70 06/02/21 1130  Resp 16 06/02/21 1130  SpO2 94 % 06/02/21 1130  Vitals shown include unvalidated device data.  Last Pain:  Vitals:   06/02/21 1120  TempSrc:   PainSc: 0-No pain         Complications: No notable events documented.

## 2021-06-02 NOTE — Anesthesia Preprocedure Evaluation (Signed)
Anesthesia Evaluation  Patient identified by MRN, date of birth, ID band Patient awake    Reviewed: Allergy & Precautions, NPO status , Patient's Chart, lab work & pertinent test results  History of Anesthesia Complications Negative for: history of anesthetic complications  Airway Mallampati: III  TM Distance: <3 FB Neck ROM: full    Dental  (+) Chipped   Pulmonary neg shortness of breath, sleep apnea ,    Pulmonary exam normal        Cardiovascular Exercise Tolerance: Good (-) angina(-) Past MI and (-) DOE negative cardio ROS Normal cardiovascular exam     Neuro/Psych negative neurological ROS  negative psych ROS   GI/Hepatic Neg liver ROS, GERD  Controlled,  Endo/Other  negative endocrine ROS  Renal/GU negative Renal ROS  negative genitourinary   Musculoskeletal   Abdominal   Peds  Hematology negative hematology ROS (+)   Anesthesia Other Findings Past Medical History: No date: Allergic rhinitis due to pollen     Comment:  mild No date: Bulging lumbar disc No date: Chronic low back pain     Comment:  Has spina bifida occulta No date: Heart palpitations     Comment:  PVCs noted on Event Monitor - some in Bigeminy or               couplets. No date: History of kidney stones No date: Hyperlipidemia No date: Moderate obesity No date: OSA on CPAP  Past Surgical History: 1998: OPEN ANTERIOR SHOULDER RECONSTRUCTION     Comment:  Right 12/28/2013: TRANSTHORACIC ECHOCARDIOGRAM     Comment:  EF 55-60%.  Normal diastolic function 06/06/2681: TREADMILL STRESS ECHO     Comment:  Ex 10:31 Min; 12.5 METS --> no EKG changes, Normal pre &              post Echo Wall Motion. -- No ischemia or infarction  BMI    Body Mass Index: 46.63 kg/m      Reproductive/Obstetrics negative OB ROS                             Anesthesia Physical Anesthesia Plan  ASA: 3  Anesthesia Plan: General    Post-op Pain Management:    Induction: Intravenous  PONV Risk Score and Plan: Propofol infusion and TIVA  Airway Management Planned: Natural Airway and Nasal Cannula  Additional Equipment:   Intra-op Plan:   Post-operative Plan:   Informed Consent: I have reviewed the patients History and Physical, chart, labs and discussed the procedure including the risks, benefits and alternatives for the proposed anesthesia with the patient or authorized representative who has indicated his/her understanding and acceptance.     Dental Advisory Given  Plan Discussed with: Anesthesiologist, CRNA and Surgeon  Anesthesia Plan Comments: (Patient consented for risks of anesthesia including but not limited to:  - adverse reactions to medications - risk of airway placement if required - damage to eyes, teeth, lips or other oral mucosa - nerve damage due to positioning  - sore throat or hoarseness - Damage to heart, brain, nerves, lungs, other parts of body or loss of life  Patient voiced understanding.)        Anesthesia Quick Evaluation

## 2021-06-02 NOTE — Anesthesia Postprocedure Evaluation (Signed)
Anesthesia Post Note  Patient: Lamario Mani  Procedure(s) Performed: COLONOSCOPY WITH PROPOFOL ESOPHAGOGASTRODUODENOSCOPY (EGD) WITH PROPOFOL  Patient location during evaluation: Endoscopy Anesthesia Type: General Level of consciousness: awake and alert Pain management: pain level controlled Vital Signs Assessment: post-procedure vital signs reviewed and stable Respiratory status: spontaneous breathing, nonlabored ventilation, respiratory function stable and patient connected to nasal cannula oxygen Cardiovascular status: blood pressure returned to baseline and stable Postop Assessment: no apparent nausea or vomiting Anesthetic complications: no   No notable events documented.   Last Vitals:  Vitals:   06/02/21 1150 06/02/21 1200  BP: 103/69 96/78  Pulse: 63 64  Resp: 17 (!) 22  Temp:    SpO2: 95% 99%    Last Pain:  Vitals:   06/02/21 1200  TempSrc:   PainSc: 0-No pain                 Precious Haws Valerya Maxton

## 2021-06-02 NOTE — Op Note (Signed)
[facility] Gastroenterology Patient Name: Elijah Logan Procedure Date: 06/02/2021 10:32 AM MRN: [id number] Account #: [id number] Date of Birth: 10-03-1980 Admit Type: Outpatient Age: 41 Room: [facility] ENDO ROOM 1 Gender: Male Note Status: Finalized Instrument Name: Jasper Riling [id number] Procedure:             Colonoscopy Indications:           Hematochezia Providers:             Andrey Farmer MD, MD Medicines:             Monitored Anesthesia Care Complications:         No immediate complications. Estimated blood loss:                         Minimal. Procedure:             Pre-Anesthesia Assessment:                        - Prior to the procedure, a History and Physical was                         performed, and patient medications and allergies were                         reviewed. The patient is competent. The risks and                         benefits of the procedure and the sedation options and                         risks were discussed with the patient. All questions                         were answered and informed consent was obtained.                         Patient identification and proposed procedure were                         verified by the physician, the nurse, the                         anesthesiologist, the anesthetist and the technician                         in the endoscopy suite. Mental Status Examination:                         alert and oriented. Airway Examination: normal                         oropharyngeal airway and neck mobility. Respiratory                         Examination: clear to auscultation. CV Examination:                         normal. Prophylactic Antibiotics: The patient does not  require prophylactic antibiotics. Prior                         Anticoagulants: The patient has taken no previous                         anticoagulant or antiplatelet agents. ASA Grade                          Assessment: II - A patient with mild systemic disease.                         After reviewing the risks and benefits, the patient                         was deemed in satisfactory condition to undergo the                         procedure. The anesthesia plan was to use monitored                         anesthesia care (MAC). Immediately prior to                         administration of medications, the patient was                         re-assessed for adequacy to receive sedatives. The                         heart rate, respiratory rate, oxygen saturations,                         blood pressure, adequacy of pulmonary ventilation, and                         response to care were monitored throughout the                         procedure. The physical status of the patient was                         re-assessed after the procedure.                        After obtaining informed consent, the colonoscope was                         passed under direct vision. Throughout the procedure,                         the patient's blood pressure, pulse, and oxygen                         saturations were monitored continuously. The                         Colonoscope was introduced through the anus and  advanced to the the terminal ileum. The colonoscopy                         was performed without difficulty. The patient                         tolerated the procedure well. The quality of the bowel                         preparation was fair. Findings:      The perianal and digital rectal examinations were normal.      The terminal ileum appeared normal.      A 1 mm polyp was found in the cecum. The polyp was sessile. The polyp       was removed with a jumbo cold forceps. Resection and retrieval were       complete. Estimated blood loss was minimal.      A 4 mm polyp was found in the ascending colon. The polyp was sessile.       The polyp was removed with a cold  snare. Resection and retrieval were       complete. Estimated blood loss was minimal.      A 1 mm polyp was found in the transverse colon. The polyp was sessile.       The polyp was removed with a jumbo cold forceps. Resection and retrieval       were complete. Estimated blood loss was minimal.      A 10 mm polyp was found in the sigmoid colon. The polyp was       pedunculated. The polyp was removed with a hot snare. Resection and       retrieval were complete. Estimated blood loss was minimal. Biopsies of       stalk base were performed.      A 15 mm polyp was found in the recto-sigmoid colon. The polyp was       pedunculated. The polyp was removed with a hot snare. Resection and       retrieval were complete. To prevent bleeding post-intervention, a       hemostatic clip was successfully placed. There was no bleeding during,       or at the end, of the procedure.      A 2 mm polyp was found in the rectum. The polyp was sessile. The polyp       was removed with a cold snare. Resection and retrieval were complete.       Estimated blood loss was minimal.      Internal hemorrhoids were found during retroflexion. The hemorrhoids       were Grade I (internal hemorrhoids that do not prolapse).      The exam was otherwise without abnormality on direct and retroflexion       views. Impression:            - Preparation of the colon was fair.                        - The examined portion of the ileum was normal.                        - One 1 mm polyp in the cecum, removed with a jumbo  cold forceps. Resected and retrieved.                        - One 4 mm polyp in the ascending colon, removed with                         a cold snare. Resected and retrieved.                        - One 1 mm polyp in the transverse colon, removed with                         a jumbo cold forceps. Resected and retrieved.                        - One 10 mm polyp in the sigmoid colon,  removed with a                         hot snare. Resected and retrieved.                        - One 15 mm polyp at the recto-sigmoid colon, removed                         with a hot snare. Resected and retrieved.                        - One 2 mm polyp in the rectum, removed with a cold                         snare. Resected and retrieved.                        - Internal hemorrhoids.                        - The examination was otherwise normal on direct and                         retroflexion views. Recommendation:        - Discharge patient to home.                        - Resume previous diet.                        - Continue present medications.                        - Await pathology results.                        - Repeat colonoscopy in 1 year because the bowel                         preparation was suboptimal.                        - Return to referring physician as previously  scheduled. Procedure Code(s):     --- Professional ---                        [phone], Colonoscopy, flexible; with removal of                         tumor(s), polyp(s), or other lesion(s) by snare                         technique                        45380, 14, Colonoscopy, flexible; with biopsy, single                         or multiple Diagnosis Code(s):     --- Professional ---                        K64.0, First degree hemorrhoids                        K63.5, Polyp of colon                        K62.1, Rectal polyp                        K92.1, Melena (includes Hematochezia) CPT copyright 2019 American Medical Association. All rights reserved. The codes documented in this report are preliminary and upon coder review may  be revised to meet current compliance requirements. Andrey Farmer MD, MD 06/02/2021 11:37:01 AM Number of Addenda: 0 Note Initiated On: 06/02/2021 10:32 AM Scope Withdrawal Time: 0 hours 13 minutes 56 seconds  Total Procedure Duration: 0  hours 26 minutes 35 seconds  Estimated Blood Loss:  Estimated blood loss was minimal.      [facility]

## 2021-06-02 NOTE — Op Note (Signed)
[facility] Gastroenterology Patient Name: Elijah Logan Procedure Date: 06/02/2021 10:32 AM MRN: [id number] Account #: [id number] Date of Birth: 1981/02/03 Admit Type: Outpatient Age: 41 Room: [facility] ENDO ROOM 1 Gender: Male Note Status: Finalized Instrument Name: Upper Endoscope [id number] Procedure:             Upper GI endoscopy Indications:           Dyspepsia Providers:             Andrey Farmer MD, MD Medicines:             Monitored Anesthesia Care Complications:         No immediate complications. Estimated blood loss:                         Minimal. Procedure:             Pre-Anesthesia Assessment:                        - Prior to the procedure, a History and Physical was                         performed, and patient medications and allergies were                         reviewed. The patient is competent. The risks and                         benefits of the procedure and the sedation options and                         risks were discussed with the patient. All questions                         were answered and informed consent was obtained.                         Patient identification and proposed procedure were                         verified by the physician, the nurse, the                         anesthesiologist, the anesthetist and the technician                         in the endoscopy suite. Mental Status Examination:                         alert and oriented. Airway Examination: normal                         oropharyngeal airway and neck mobility. Respiratory                         Examination: clear to auscultation. CV Examination:                         normal. Prophylactic Antibiotics: The patient does not  require prophylactic antibiotics. Prior                         Anticoagulants: The patient has taken no previous                         anticoagulant or antiplatelet agents. ASA Grade                          Assessment: II - A patient with mild systemic disease.                         After reviewing the risks and benefits, the patient                         was deemed in satisfactory condition to undergo the                         procedure. The anesthesia plan was to use monitored                         anesthesia care (MAC). Immediately prior to                         administration of medications, the patient was                         re-assessed for adequacy to receive sedatives. The                         heart rate, respiratory rate, oxygen saturations,                         blood pressure, adequacy of pulmonary ventilation, and                         response to care were monitored throughout the                         procedure. The physical status of the patient was                         re-assessed after the procedure.                        After obtaining informed consent, the endoscope was                         passed under direct vision. Throughout the procedure,                         the patient's blood pressure, pulse, and oxygen                         saturations were monitored continuously. The Endoscope                         was introduced through the mouth, and advanced to the  second part of duodenum. The upper GI endoscopy was                         accomplished without difficulty. The patient tolerated                         the procedure well. Findings:      The examined esophagus was normal.      A single localized erosion with no bleeding and no stigmata of recent       bleeding was found in the gastric body. Biopsies were taken with a cold       forceps for histology. Estimated blood loss was minimal.      The exam of the stomach was otherwise normal.      Biopsies were taken with a cold forceps in the stomach for Helicobacter       pylori testing. Estimated blood loss was minimal.      The examined duodenum was  normal. Impression:            - Normal esophagus.                        - Gastric erosion with no bleeding and no stigmata of                         recent bleeding. Biopsied.                        - Normal examined duodenum.                        - Biopsies were taken with a cold forceps for                         Helicobacter pylori testing. Recommendation:        - Discharge patient to home.                        - Resume previous diet.                        - Continue present medications.                        - Await pathology results.                        - Perform a colonoscopy today. Procedure Code(s):     --- Professional ---                        [phone], Esophagogastroduodenoscopy, flexible,                         transoral; with biopsy, single or multiple Diagnosis Code(s):     --- Professional ---                        K25.9, Gastric ulcer, unspecified as acute or chronic,                         without hemorrhage or perforation  R10.13, Epigastric pain CPT copyright 2019 American Medical Association. All rights reserved. The codes documented in this report are preliminary and upon coder review may  be revised to meet current compliance requirements. Andrey Farmer MD, MD 06/02/2021 11:31:03 AM Number of Addenda: 0 Note Initiated On: 06/02/2021 10:32 AM Estimated Blood Loss:  Estimated blood loss was minimal.      [facility]

## 2021-06-02 NOTE — Interval H&P Note (Signed)
History and Physical Interval Note:  06/02/2021 10:38 AM  Elijah Logan  has presented today for surgery, with the diagnosis of Epigastric pain; Abdominal pain, LUQ (left upper quadrant); Rectal bleeding; NSAID long-term use; Positive Helicobacter pylori serology.  The various methods of treatment have been discussed with the patient and family. After consideration of risks, benefits and other options for treatment, the patient has consented to  Procedure(s): COLONOSCOPY WITH PROPOFOL (N/A) ESOPHAGOGASTRODUODENOSCOPY (EGD) WITH PROPOFOL (N/A) as a surgical intervention.  The patient's history has been reviewed, patient examined, no change in status, stable for surgery.  I have reviewed the patient's chart and labs.  Questions were answered to the patient's satisfaction.     Lesly Rubenstein  Ok to proceed with EGD/Colonoscopy

## 2021-06-02 NOTE — Anesthesia Procedure Notes (Signed)
Date/Time: 06/02/2021 10:50 AM Performed by: Nelda Marseille, CRNA Pre-anesthesia Checklist: Emergency Drugs available, Suction available, Patient being monitored and Timeout performed Oxygen Delivery Method: Simple face mask Ventilation: Oral airway inserted - appropriate to patient size

## 2021-06-02 NOTE — H&P (Signed)
Outpatient short stay form Pre-procedure 06/02/2021  Lesly Rubenstein, MD  Primary Physician: Venia Carbon, MD  Reason for visit:  Dyspepsia/Rectal bleeding  History of present illness:    41 y/o gentleman with obesity here for EGD/Colonoscopy for dyspepsia and intermittent small volume hematochezia. No blood thinners. No family history of GI malignancies. No abdominal surgeries.    Current Facility-Administered Medications:    0.9 %  sodium chloride infusion, , Intravenous, Continuous, Jaremy Nosal, Hilton Cork, MD, Last Rate: 20 mL/hr at 06/02/21 1024, New Bag at 06/02/21 1024  Medications Prior to Admission  Medication Sig Dispense Refill Last Dose   ibuprofen (MOTRIN IB) 200 MG tablet Take 3 tablets (600 mg total) by mouth every 6 (six) hours as needed. 20 tablet 0 Past Month   loratadine (CLARITIN) 10 MG tablet Take 10 mg by mouth daily.   Past Month   sucralfate (CARAFATE) 1 g tablet Take 1 g by mouth 4 (four) times daily -  with meals and at bedtime.   Past Month   cyclobenzaprine (FLEXERIL) 10 MG tablet Take 0.5-1 tablets (5-10 mg total) by mouth 3 times/day as needed-between meals & bedtime for muscle spasms. (Patient not taking: Reported on 06/02/2021) 15 tablet 0 Not Taking   famotidine (PEPCID) 20 MG tablet Take 20 mg by mouth daily as needed for heartburn or indigestion.   05/31/2021   fluticasone (FLONASE) 50 MCG/ACT nasal spray Place 2 sprays into both nostrils as needed.    05/31/2021   hyoscyamine (LEVSIN SL) 0.125 MG SL tablet Place 1 tablet (0.125 mg total) every 4 (four) hours as needed under the tongue. (Patient not taking: Reported on 06/01/2021) 60 tablet 3 Completed Course   pantoprazole (PROTONIX) 40 MG tablet Take 1 tablet (40 mg total) by mouth 2 (two) times daily. 60 tablet 3 05/31/2021     No Known Allergies   Past Medical History:  Diagnosis Date   Allergic rhinitis due to pollen    mild   Bulging lumbar disc    Chronic low back pain    Has spina bifida  occulta   Heart palpitations    PVCs noted on Event Monitor - some in Bigeminy or couplets.   History of kidney stones    Hyperlipidemia    Moderate obesity    OSA on CPAP     Review of systems:  Otherwise negative.    Physical Exam  Gen: Alert, oriented. Appears stated age.  HEENT: PERRLA. Lungs: No respiratory distress CV: RRR Abd: soft, benign, no masses Ext: No edema    Planned procedures: Proceed with EGD/colonoscopy. The patient understands the nature of the planned procedure, indications, risks, alternatives and potential complications including but not limited to bleeding, infection, perforation, damage to internal organs and possible oversedation/side effects from anesthesia. The patient agrees and gives consent to proceed.  Please refer to procedure notes for findings, recommendations and patient disposition/instructions.     Lesly Rubenstein, MD [facility]

## 2021-06-03 ENCOUNTER — Encounter: Payer: Self-pay | Admitting: Gastroenterology

## 2021-06-04 LAB — SURGICAL PATHOLOGY

## 2021-09-18 ENCOUNTER — Encounter: Payer: BC Managed Care – PPO | Admitting: Internal Medicine

## 2021-11-16 ENCOUNTER — Ambulatory Visit (INDEPENDENT_AMBULATORY_CARE_PROVIDER_SITE_OTHER): Payer: BC Managed Care – PPO | Admitting: Internal Medicine

## 2021-11-16 ENCOUNTER — Encounter: Payer: Self-pay | Admitting: Internal Medicine

## 2021-11-16 VITALS — BP 122/86 | HR 80 | Temp 98.0°F | Ht 70.0 in | Wt 331.0 lb

## 2021-11-16 DIAGNOSIS — K219 Gastro-esophageal reflux disease without esophagitis: Secondary | ICD-10-CM

## 2021-11-16 DIAGNOSIS — G4733 Obstructive sleep apnea (adult) (pediatric): Secondary | ICD-10-CM

## 2021-11-16 DIAGNOSIS — Z0000 Encounter for general adult medical examination without abnormal findings: Secondary | ICD-10-CM | POA: Diagnosis not present

## 2021-11-16 MED ORDER — CYCLOBENZAPRINE HCL 10 MG PO TABS: 5.0000 mg | ORAL_TABLET | Freq: Two times a day (BID) | ORAL | 0 refills | Status: DC | PRN

## 2021-11-16 NOTE — Progress Notes (Signed)
Subjective:    Patient ID: Elijah Logan, male    DOB: 02-25-1981, 41 y.o.   MRN: [id number]  HPI Here for physical  Continues to sleep with a CPAP Sleep study confirmed severe OSA--- AHI 68 He believes he has auto titrate---but probably around 10 usually  Stomach is better Still gets a nerve feeling in the morning--one erosion on EGD He continues on famotidine 20 daily Uses the sucralfate prn--like before pizza/Italian Avoids alcohol  Multiple polyps ---will need another colon after 1 year  See chiropractor periodically for neck and back pain Some muscle tension neck and headaches--better with twice a week adjustments and now down to weekly He has better sense of body issues since then---thinks he has had periodic rib dislocation, etc (and a low back muscle that he pulls and will put him off balance)---and quicker to recovery with adjustments  Trying to walk with wife Working on "anti-inflammatory diet" with her as well Had lost over 10#--but gained some back   Current Outpatient Medications on File Prior to Visit  Medication Sig Dispense Refill   cyclobenzaprine (FLEXERIL) 10 MG tablet Take 0.5-1 tablets (5-10 mg total) by mouth 3 times/day as needed-between meals & bedtime for muscle spasms. 15 tablet 0   famotidine (PEPCID) 20 MG tablet Take 20 mg by mouth daily as needed for heartburn or indigestion.     fluticasone (FLONASE) 50 MCG/ACT nasal spray Place 2 sprays into both nostrils as needed.      ibuprofen (MOTRIN IB) 200 MG tablet Take 3 tablets (600 mg total) by mouth every 6 (six) hours as needed. 20 tablet 0   loratadine (CLARITIN) 10 MG tablet Take 10 mg by mouth daily.     hyoscyamine (LEVSIN SL) 0.125 MG SL tablet Place 1 tablet (0.125 mg total) every 4 (four) hours as needed under the tongue. (Patient not taking: Reported on 06/01/2021) 60 tablet 3   sucralfate (CARAFATE) 1 g tablet Take 1 g by mouth 4 (four) times daily -  with meals and at bedtime. (Patient not  taking: Reported on 11/16/2021)     No current facility-administered medications on file prior to visit.    No Known Allergies  Past Medical History:  Diagnosis Date   Allergic rhinitis due to pollen    mild   Bulging lumbar disc    Chronic low back pain    Has spina bifida occulta   Heart palpitations    PVCs noted on Event Monitor - some in Bigeminy or couplets.   History of kidney stones    Hyperlipidemia    Moderate obesity    OSA on CPAP     Past Surgical History:  Procedure Laterality Date   COLONOSCOPY WITH PROPOFOL N/A 06/02/2021   Procedure: COLONOSCOPY WITH PROPOFOL;  Surgeon: Lesly Rubenstein, MD;  Location: [facility];  Service: Endoscopy;  Laterality: N/A;   ESOPHAGOGASTRODUODENOSCOPY (EGD) WITH PROPOFOL N/A 06/02/2021   Procedure: ESOPHAGOGASTRODUODENOSCOPY (EGD) WITH PROPOFOL;  Surgeon: Lesly Rubenstein, MD;  Location: [facility];  Service: Endoscopy;  Laterality: N/A;   OPEN ANTERIOR SHOULDER RECONSTRUCTION  1998   Right   TRANSTHORACIC ECHOCARDIOGRAM  12/28/2013   EF 55-60%.  Normal diastolic function   TREADMILL STRESS ECHO  01/02/2014   Ex 10:31 Min; 12.5 METS --> no EKG changes, Normal pre & post Echo Wall Motion. -- No ischemia or infarction    Family History  Problem Relation Age of Onset   Diabetes Mother        borderline  Diabetes Father        borderline   Heart disease Father        MI and needed defibrillation; PCI x3   Heart attack Father    Hypertension Other    Stroke Other    Cancer Other        prostate cancer   Diabetes Maternal Grandmother    Diabetes Paternal Grandmother    Prostate cancer Other    Kidney cancer Neg Hx    Bladder Cancer Neg Hx     Social History   Socioeconomic History   Marital status: Married    Spouse name: Not on file   Number of children: 2   Years of education: Not on file   Highest education level: Not on file  Occupational History   Occupation: Radio broadcast assistant    Comment:  [employer]---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 His wife is a Designer, jewellery at [employer] Strain: Not on [employer] Insecurity: Not on file  Transportation Needs: Not on file  Physical Activity: Not on file  Stress: Not on file  Social Connections: Not on file  Intimate Partner Violence: Not on file   Review of Systems  Constitutional:  Negative for fatigue.       Wears seat belt Weight down 8# since last year  HENT:  Negative for dental problem, hearing loss and tinnitus.        Keeps up with dentist  Eyes:        Transient blurry vision or double vision after a full day at the computer. Better with prescription sunglasses  Respiratory:  Negative for cough, chest tightness and shortness of breath.   Cardiovascular:  Negative for chest pain, palpitations and leg swelling.  Gastrointestinal:        Bleed for a while after polypectomies Some loose stools  Endocrine: Negative for polydipsia and polyuria.  Genitourinary:  Negative for difficulty urinating and urgency.       Recent kidney stone--passed it No sexual problems  Musculoskeletal:  Positive for back pain and neck pain. Negative for joint swelling.  Skin:  Negative for rash.       Has spot on right leg to check---?varicosity Is planning derm evaluation  Allergic/Immunologic: Positive for environmental allergies. Negative for immunocompromised state.       Claritin/flonase prn are effective  Neurological:  Negative for dizziness, syncope and light-headedness.        Rare migraines from past concussions  Hematological:  Negative for adenopathy. Does not bruise/bleed easily.  Psychiatric/Behavioral:  Negative for dysphoric mood and sleep disturbance.        Only work related anxiety       Objective:   Physical Exam Constitutional:      Appearance: Normal appearance.  HENT:     Mouth/Throat:     Pharynx: No oropharyngeal exudate or posterior oropharyngeal erythema.  Eyes:     Conjunctiva/sclera: Conjunctivae  normal.     Pupils: Pupils are equal, round, and reactive to light.  Cardiovascular:     Rate and Rhythm: Normal rate and regular rhythm.     Pulses: Normal pulses.     Heart sounds: No murmur heard.    No gallop.  Pulmonary:     Effort: Pulmonary effort is normal.     Breath sounds: Normal breath sounds. No wheezing or rales.  Abdominal:     Palpations: Abdomen is soft.     Tenderness: There is no abdominal tenderness.  Musculoskeletal:     Cervical back: Neck supple.     Right lower leg: No edema.     Left lower leg: No edema.     Comments: Reducible varicose vein upper inside right calf--discussed support socks  Lymphadenopathy:     Cervical: No cervical adenopathy.  Skin:    Findings: No lesion or rash.  Neurological:     General: No focal deficit present.     Mental Status: He is alert and oriented to person, place, and time.  Psychiatric:        Mood and Affect: Mood normal.        Behavior: Behavior normal.            Assessment & Plan:

## 2021-11-16 NOTE — Assessment & Plan Note (Signed)
Rx written for new CPAP machine Auto titrate 5-15

## 2021-11-16 NOTE — Assessment & Plan Note (Signed)
Healthy Prefers no COVID or flu vaccines Wants to wait on Td Discussed healthy eating and increasing exercise Will need repeat colonoscopy in the next year due to multiple polyps

## 2021-11-16 NOTE — Assessment & Plan Note (Signed)
Better now with famotidine Uses prn sucralfate before inciting foodz

## 2021-11-17 LAB — COMPREHENSIVE METABOLIC PANEL
ALT: 26 U/L (ref 0–53)
AST: 17 U/L (ref 0–37)
Albumin: 4.4 g/dL (ref 3.5–5.2)
Alkaline Phosphatase: 67 U/L (ref 39–117)
BUN: 13 mg/dL (ref 6–23)
CO2: 25 mEq/L (ref 19–32)
Calcium: 9.2 mg/dL (ref 8.4–10.5)
Chloride: 103 mEq/L (ref 96–112)
Creatinine, Ser: 0.88 mg/dL (ref 0.40–1.50)
GFR: 107.45 mL/min (ref >60.00)
Glucose, Bld: 94 mg/dL (ref 70–99)
Potassium: 3.9 mEq/L (ref 3.5–5.1)
Sodium: 138 mEq/L (ref 135–145)
Total Bilirubin: 0.4 mg/dL (ref 0.2–1.2)
Total Protein: 6.8 g/dL (ref 6.0–8.3)

## 2021-11-17 LAB — LIPID PANEL
Cholesterol: 186 mg/dL (ref 0–200)
HDL: 32 mg/dL — ABNORMAL LOW (ref >39.00)
NonHDL: 154.02
Total CHOL/HDL Ratio: 6
Triglycerides: 284 mg/dL — ABNORMAL HIGH (ref 0.0–149.0)
VLDL: 56.8 mg/dL — ABNORMAL HIGH (ref 0.0–40.0)

## 2021-11-17 LAB — CBC
HCT: 42.5 % (ref 39.0–52.0)
Hemoglobin: 14.4 g/dL (ref 13.0–17.0)
MCHC: 33.9 g/dL (ref 30.0–36.0)
MCV: 85.8 fl (ref 78.0–100.0)
Platelets: 224 10*3/uL (ref 150.0–400.0)
RBC: 4.95 Mil/uL (ref 4.22–5.81)
RDW: 13.5 % (ref 11.5–15.5)
WBC: 7.7 10*3/uL (ref 4.0–10.5)

## 2021-11-17 LAB — LDL CHOLESTEROL, DIRECT: Direct LDL: 123 mg/dL

## 2021-12-10 DIAGNOSIS — G4733 Obstructive sleep apnea (adult) (pediatric): Secondary | ICD-10-CM | POA: Diagnosis not present

## 2021-12-16 ENCOUNTER — Telehealth: Payer: Self-pay | Admitting: Internal Medicine

## 2021-12-16 NOTE — Telephone Encounter (Signed)
Mary from [employer] called asking for documents to be faxed over for the rx for machine pressure settings. Fax number:[phone]

## 2021-12-16 NOTE — Telephone Encounter (Signed)
Spoke to someone else at [facility] that we do not have a way to download his settings and that a rx was handwritten for the pt with the auto-titration of 5-15. Pt should have the written rx. They will reach out to him.

## 2022-01-10 DIAGNOSIS — G4733 Obstructive sleep apnea (adult) (pediatric): Secondary | ICD-10-CM | POA: Diagnosis not present

## 2022-02-01 DIAGNOSIS — L814 Other melanin hyperpigmentation: Secondary | ICD-10-CM | POA: Diagnosis not present

## 2022-02-01 DIAGNOSIS — L738 Other specified follicular disorders: Secondary | ICD-10-CM | POA: Diagnosis not present

## 2022-02-01 DIAGNOSIS — D2362 Other benign neoplasm of skin of left upper limb, including shoulder: Secondary | ICD-10-CM | POA: Diagnosis not present

## 2022-02-01 DIAGNOSIS — L821 Other seborrheic keratosis: Secondary | ICD-10-CM | POA: Diagnosis not present

## 2022-02-09 DIAGNOSIS — G4733 Obstructive sleep apnea (adult) (pediatric): Secondary | ICD-10-CM | POA: Diagnosis not present

## 2022-03-12 DIAGNOSIS — G4733 Obstructive sleep apnea (adult) (pediatric): Secondary | ICD-10-CM | POA: Diagnosis not present

## 2022-03-18 NOTE — Therapy (Signed)
OUTPATIENT PHYSICAL THERAPY SHOULDER EVALUATION  Patient Name: Elijah Logan MRN: [id number] DOB:01-Feb-1981, 41 y.o., male Today's Date: 03/19/2022   PT End of Session - 03/19/22 1438     Visit Number 1    Number of Visits 17    Date for PT Re-Evaluation 05/14/22    Authorization Type eval: 03/19/22    PT Start Time 0848    PT Stop Time 0935    PT Time Calculation (min) 47 min    Activity Tolerance Patient limited by pain    Behavior During Therapy [facility] for tasks assessed/performed            Past Medical History:  Diagnosis Date   Allergic rhinitis due to pollen    mild   Bulging lumbar disc    Chronic low back pain    Has spina bifida occulta   Heart palpitations    PVCs noted on Event Monitor - some in Bigeminy or couplets.   History of kidney stones    Hyperlipidemia    Moderate obesity    OSA on CPAP    Past Surgical History:  Procedure Laterality Date   COLONOSCOPY WITH PROPOFOL N/A 06/02/2021   Procedure: COLONOSCOPY WITH PROPOFOL;  Surgeon: Lesly Rubenstein, MD;  Location: [facility];  Service: Endoscopy;  Laterality: N/A;   ESOPHAGOGASTRODUODENOSCOPY (EGD) WITH PROPOFOL N/A 06/02/2021   Procedure: ESOPHAGOGASTRODUODENOSCOPY (EGD) WITH PROPOFOL;  Surgeon: Lesly Rubenstein, MD;  Location: [facility];  Service: Endoscopy;  Laterality: N/A;   OPEN ANTERIOR SHOULDER RECONSTRUCTION  1998   Right   TRANSTHORACIC ECHOCARDIOGRAM  12/28/2013   EF 55-60%.  Normal diastolic function   TREADMILL STRESS ECHO  01/02/2014   Ex 10:31 Min; 12.5 METS --> no EKG changes, Normal pre & post Echo Wall Motion. -- No ischemia or infarction   Patient Active Problem List   Diagnosis Date Noted   Epigastric pain 01/13/2021   Rectal bleeding 01/13/2021   GERD (gastroesophageal reflux disease) 06/04/2019   IBS (irritable bowel syndrome) 12/19/2015   PVC's (premature ventricular contractions) 12/07/2013   Obesity, morbid, BMI 40.0-49.9 ([place]) 10/22/2013   Routine general  medical examination at a health care facility 09/29/2012   OSA (obstructive sleep apnea) 09/29/2012   Chronic low back pain    Allergic rhinitis due to pollen    PCP: Venia Carbon, MD  REFERRING PROVIDER: Earleen Newport, MD  REFERRING DIAGNOSIS: L elbow pain  THERAPY DIAG: Pain in left elbow  RATIONALE FOR EVALUATION AND TREATMENT: Rehabilitation  ONSET DATE: 01/01/22 (approximate)  FOLLOW UP APPT WITH PROVIDER: Yes    SUBJECTIVE:  Chief Complaint: L elbow pain  Pertinent History Pt referred to physical therapy for L elbow pain which started around September of this year. He was working in the yard [employer] and had some straining in his posterolateral L forearm. Pain improved slightly however then worsened again. Since that time pain has remained persistent. He is noticing weakness in his grip and even has concerns for dropping objects from his L hand when pain hits. He has been using a tennis elbow strap which offers temporary relief while he is wearing it but the pain returns as soon as he removes the brace. He has also tried ice which does offer some relief. Pt experienced a neck injury about a month ago while sleeping however this has been slowly improving. He denies any change in his L elbow pain with neck positions or movements.   Pain:  Pain Intensity: Present: 4/10, Best: 4/10, Worst: 7/10 Pain location: L posterior/lateral forearm near the elbow radiating down the posterior aspect of the forearm.  Pain Quality: burning and throbbing Radiating: Yes down the posterior (dorsal) forearm Numbness/Tingling: Yes, initially he had some numbness in his forearm when pain started but not currently Focal Weakness: Yes, sudden loss of grip strength with severe pain Aggravating factors: gripping,  lifting, supination of forearm  Relieving factors: tumeric, ice, tennis elbow strap  24-hour pain behavior: constant History of prior shoulder or neck/shoulder injury, pain, surgery, or therapy: Yes, history of neck and R shoulder pain. No L shoulder or elbow issues. History of similar symptoms in R elbow which resolved; Dominant hand: right Imaging: No  Prior level of function: Independent Occupational demands: Office job at the computer, pt denies pain with typing on the computer; Hobbies: Golf Red flags (personal history of cancer, chills/fever, night sweats, nausea, vomiting, unrelenting pain): Negative  Precautions: None  Weight Bearing Restrictions: No  Living Environment Lives with: lives with their family Lives in: House/apartment  Patient Goals: Complete all responsibilities at home and work without an increase in pain   OBJECTIVE:   Patient Surveys  FOTO: 32, predicted improvement to 65 QuickDASH: To be completed  Cognition Patient is oriented to person, place, and time.  Recent memory is intact.  Remote memory is intact.  Attention span and concentration are intact.  Expressive speech is intact.  Patient's fund of knowledge is within normal limits for educational level.    Gross Musculoskeletal Assessment Tremor: None Bulk: Normal Tone: Normal  Gait Deferred  Posture No gross deficits identified short of mild forward head posture which he is able to correct with awareness;  Cervical Screen AROM: WFL and painless with overpressure in all planes Spurlings A (ipsilateral lateral flexion/axial compression): R: Negative L: Negative Spurlings B (ipsilateral lateral flexion/contralateral rotation/axial compression): R: Negative L: Negative Repeated movement: Deferred Hoffman Sign (cervical cord compression): R: Not examined L: Not examined ULTT Median: R: Not examined L: Not examined ULTT Ulnar: R: Not examined L: Not examined ULTT Radial: R: Not examined L:  Not examined   AROM AROM (Normal range in degrees) AROM 03/19/2022  Cervical  Flexion (50)   Extension (80)   Right lateral flexion (45)   Left lateral flexion (45)   Right rotation (85)   Left rotation (85)    Right Left  Shoulder    Flexion WNL WNL  Extension    Abduction WNL WNL  External Rotation    Internal Rotation    Hands Behind Head    Hands Behind Back  Elbow    Flexion  WNL  Extension  WNL  Pronation  WNL  Supination  WNL  (* = pain; Blank rows = not tested)   UE MMT:  MMT (out of 5) Right 03/19/2022 Left 03/19/2022  Cervical (isometric)  Flexion WNL  Extension WNL  Lateral Flexion WNL WNL  Rotation WNL WNL      Shoulder   Flexion 5 5  Extension    Abduction 5 5  External rotation    Internal rotation    Horizontal abduction    Horizontal adduction    Lower Trapezius    Rhomboids        Elbow  Flexion 5 5  Extension 5 5  Pronation  5  Supination  5      Wrist  Flexion 5 5*  Extension 5 5*  Radial deviation  5*  Ulnar deviation  5      MCP  Flexion 5 5*  Extension 5 5*  Abduction    Adduction    (* = pain; Blank rows = not tested)  Grip Strength (avg): R: 113.7#, 116.8# (115.3#), L (pain-free): 21.5#, 19.4# (20.5#)    Sensation Deferred  Reflexes Deferred  Palpation Exquisitely painful to palpation over L common extensor tendon as well as over lateral epicondyle and extending down wrist/finger extensors in dorsal forearm  Repeated Movements Deferred  Passive Accessory Intervertebral Motion Deferred  Accessory Motions/Glides Deferred  Muscle Length Testing Deferred  Special Testing: Deferred  Beighton scale: Deferred   TODAY'S TREATMENT   Trigger Point Dry Needling (TDN), unbilled Education performed with patient regarding potential benefit of TDN. Reviewed precautions and risks with patient. Pt provided verbal consent to treatment. In sitting using clean technique TDN performed to L wrist extensors  (focus on ECRB) with 2, 0.25 x 40 single needle placements with local twitch response (LTR) during both placements. Pistoning technique utilized. Deep ache/soreness reported afterwards  Issued HEP and applied KT tape in supportive pattern over L common extensor tendon for offloading;   PATIENT EDUCATION:  Education details: Plan of care and HEP Person educated: Patient Education method: Explanation, verbal cues, handout Education comprehension: verbalized understanding   HOME EXERCISE PROGRAM: Access Code: [email or web address] URL: [email or web address] Date: 03/19/2022 Prepared by: Roxana Hires  Exercises - Standing Wrist Flexion Stretch  - 4-6 x daily - 7 x weekly - 3 reps - 45-60s hold - Isometric Wrist Extension Pronated  - 2 x daily - 7 x weekly - 2 sets - 5 reps - 20-30s hold - Tennis Elbow Self Massage (Mirrored)  - 4-6 x daily - 7 x weekly - 3-5 minutes hold  Patient Education - Tennis Elbow - Ice Massage   ASSESSMENT:  CLINICAL IMPRESSION: Patient is a 42 y.o. male who was seen today for physical therapy evaluation and treatment for  lateral L elbow pain. Objective impairments include decreased strength and pain. These impairments are limiting patient from meal prep, cleaning, and community activity. Personal factors including Time since onset of injury/illness/exacerbation and 1 comorbidity: chronic neck pain  are also affecting patient's functional outcome. Patient will benefit from skilled PT to address above impairments and improve overall function.  REHAB POTENTIAL: Good  CLINICAL DECISION MAKING: Stable/uncomplicated  EVALUATION COMPLEXITY: Low   GOALS: Goals reviewed with patient? Yes  SHORT TERM GOALS: Target date: 04/16/2022  Pt will be independent with HEP to improve strength and decrease elbow pain to improve pain-free function at home and work. Baseline:  Goal status: INITIAL   LONG TERM  GOALS: Target date: 05/14/2022  Pt will increase FOTO  to at least 78 to demonstrate significant improvement in function at home and work related to neck pain  Baseline: 03/19/22: 67 Goal status: INITIAL  2.  Pt will decrease worst elbow pain by at least 3 points on the NPRS in order to demonstrate clinically significant reduction in elbow pain. Baseline: 03/19/22: worst: 7/10; Goal status: INITIAL  3.  Pt will decrease quick DASH score by at least 8% in order to demonstrate clinically significant reduction in disability related to elbow pain        Baseline: 03/19/22: To be completed Goal status: INITIAL  4. Pt will increase pain-free L grip strength to within 10% of RUE in order to demonstrate improvement in strength and function         Baseline: 03/19/22: Grip Strength (avg of two trials): R: 115.3# L (pain-free): 20.5# Goal status: INITIAL   PLAN: PT FREQUENCY: 1-2x/week  PT DURATION: 8 weeks  PLANNED INTERVENTIONS: Therapeutic exercises, Therapeutic activity, Neuromuscular re-education, Balance training, Gait training, Patient/Family education, Joint manipulation, Joint mobilization, Vestibular training, Canalith repositioning, Aquatic Therapy, Dry Needling, Electrical stimulation, Spinal manipulation, Spinal mobilization, Cryotherapy, Moist heat, Traction, Ultrasound, Ionotophoresis '4mg'$ /ml Dexamethasone, and Manual therapy  PLAN FOR NEXT SESSION: Pt to complete QuickDASH, assess response to TDN, review/modify HEP as needed, progressive strengthening LUE (isometric, eccentric, concentric);  Lyndel Safe Margarita Croke PT, DPT, GCS  Amaia Lavallie 03/19/2022, 2:57 PM

## 2022-03-19 ENCOUNTER — Ambulatory Visit: Payer: BC Managed Care – PPO | Attending: Emergency Medicine

## 2022-03-19 DIAGNOSIS — M25522 Pain in left elbow: Secondary | ICD-10-CM | POA: Insufficient documentation

## 2022-05-10 ENCOUNTER — Ambulatory Visit: Payer: No Typology Code available for payment source | Attending: Emergency Medicine

## 2022-05-10 DIAGNOSIS — M25522 Pain in left elbow: Secondary | ICD-10-CM | POA: Diagnosis present

## 2022-05-10 NOTE — Therapy (Signed)
OUTPATIENT PHYSICAL THERAPY ELBOW TREATMENT  Patient Name: Floyde Dingley MRN: [id number] DOB:1980/09/08, 42 y.o., male Today's Date: 05/10/2022   PT End of Session - 05/10/22 0951     Visit Number 2    Number of Visits 17    Date for PT Re-Evaluation 05/14/22    Authorization Type eval: 03/19/22    PT Start Time 0804    PT Stop Time 0850    PT Time Calculation (min) 46 min    Activity Tolerance Patient limited by pain    Behavior During Therapy [facility] for tasks assessed/performed            Past Medical History:  Diagnosis Date   Allergic rhinitis due to pollen    mild   Bulging lumbar disc    Chronic low back pain    Has spina bifida occulta   Heart palpitations    PVCs noted on Event Monitor - some in [place] or couplets.   History of kidney stones    Hyperlipidemia    Moderate obesity    OSA on CPAP    Past Surgical History:  Procedure Laterality Date   COLONOSCOPY WITH PROPOFOL N/A 06/02/2021   Procedure: COLONOSCOPY WITH PROPOFOL;  Surgeon: Lesly Rubenstein, MD;  Location: [facility] ENDOSCOPY;  Service: Endoscopy;  Laterality: N/A;   ESOPHAGOGASTRODUODENOSCOPY (EGD) WITH PROPOFOL N/A 06/02/2021   Procedure: ESOPHAGOGASTRODUODENOSCOPY (EGD) WITH PROPOFOL;  Surgeon: Lesly Rubenstein, MD;  Location: [facility] ENDOSCOPY;  Service: Endoscopy;  Laterality: N/A;   OPEN ANTERIOR SHOULDER RECONSTRUCTION  1998   Right   TRANSTHORACIC ECHOCARDIOGRAM  12/28/2013   EF 55-60%.  Normal diastolic function   TREADMILL STRESS ECHO  01/02/2014   Ex 10:31 Min; 12.5 METS --> no EKG changes, Normal pre & post Echo Wall Motion. -- No ischemia or infarction   Patient Active Problem List   Diagnosis Date Noted   Epigastric pain 01/13/2021   Rectal bleeding 01/13/2021   GERD (gastroesophageal reflux disease) 06/04/2019   IBS (irritable bowel syndrome) 12/19/2015   PVC's (premature ventricular contractions) 12/07/2013   Obesity, morbid, BMI 40.0-49.9 ([place]) 10/22/2013   Routine general medical  examination at a health care facility 09/29/2012   OSA (obstructive sleep apnea) 09/29/2012   Chronic low back pain    Allergic rhinitis due to pollen    PCP: Venia Carbon, MD  REFERRING PROVIDER: Earleen Newport, MD  REFERRING DIAGNOSIS: L elbow pain  THERAPY DIAG: Pain in left elbow  RATIONALE FOR EVALUATION AND TREATMENT: Rehabilitation  ONSET DATE: 01/01/22 (approximate)  FOLLOW UP APPT WITH PROVIDER: Yes   FROM INITIAL EVALUATION SUBJECTIVE:  Chief Complaint: L elbow pain  Pertinent History Pt referred to physical therapy for L elbow pain which started around September of this year. He was working in the yard [employer] and had some straining in his posterolateral L forearm. Pain improved slightly however then worsened again. Since that time pain has remained persistent. He is noticing weakness in his grip and even has concerns for dropping objects from his L hand when pain hits. He has been using a tennis elbow strap which offers temporary relief while he is wearing it but the pain returns as soon as he removes the brace. He has also tried ice which does offer some relief. Pt experienced a neck injury about a month ago while sleeping however this has been slowly improving. He denies any change in his L elbow pain with neck positions or movements.   Pain:  Pain Intensity: Present: 4/10, Best: 4/10, Worst: 7/10 Pain location: L posterior/lateral forearm near the elbow radiating down the posterior aspect of the forearm.  Pain Quality: burning and throbbing Radiating: Yes down the posterior (dorsal) forearm Numbness/Tingling: Yes, initially he had some numbness in his forearm when pain started but not currently Focal Weakness: Yes, sudden loss of grip strength with severe pain Aggravating  factors: gripping, lifting, supination of forearm  Relieving factors: tumeric, ice, tennis elbow strap  24-hour pain behavior: constant History of prior shoulder or neck/shoulder injury, pain, surgery, or therapy: Yes, history of neck and R shoulder pain. No L shoulder or elbow issues. 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  Recent memory is intact.  Remote memory is intact.  Attention span and concentration are intact.  Expressive speech is intact.  Patient's fund of knowledge is within normal limits for educational level.    Gross Musculoskeletal Assessment Tremor: None Bulk: Normal Tone: Normal  Gait Deferred  Posture No gross deficits identified short of mild forward head posture which he is able to correct with awareness;  Cervical Screen AROM: WFL and painless with overpressure in all planes Spurlings A (ipsilateral lateral flexion/axial compression): R: Negative L: Negative Spurlings B (ipsilateral lateral flexion/contralateral rotation/axial compression): R: Negative L: Negative Repeated movement: Deferred Hoffman Sign (cervical cord compression): R: Not examined L: Not examined ULTT Median: R: Not examined L: Not examined ULTT Ulnar: R: Not examined L: Not examined ULTT Radial:  R: Not examined L: Not examined  AROM AROM (Normal range in degrees) AROM 03/19/2022  Cervical  Flexion (50)   Extension (80)   Right lateral flexion (45)   Left lateral flexion (45)   Right rotation (85)   Left rotation (85)    Right Left  Shoulder    Flexion WNL WNL  Extension    Abduction WNL WNL  External Rotation    Internal Rotation    Hands Behind Head    Hands Behind Back  Elbow    Flexion  WNL  Extension  WNL  Pronation  WNL  Supination  WNL  (* = pain; Blank rows = not tested)   UE MMT: MMT (out of 5) Right 03/19/2022 Left 03/19/2022  Cervical (isometric)  Flexion WNL  Extension WNL  Lateral Flexion WNL WNL  Rotation WNL WNL      Shoulder   Flexion 5 5  Extension    Abduction 5 5  External rotation    Internal rotation    Horizontal abduction    Horizontal adduction    Lower Trapezius    Rhomboids        Elbow  Flexion 5 5  Extension 5 5  Pronation  5  Supination  5      Wrist  Flexion 5 5*  Extension 5 5*  Radial deviation  5*  Ulnar deviation  5      MCP  Flexion 5 5*  Extension 5 5*  Abduction    Adduction    (* = pain; Blank rows = not tested)  Grip Strength (avg): R: 113.7#, 116.8# (115.3#), L (pain-free): 21.5#, 19.4# (20.5#)    Sensation Deferred  Reflexes Deferred  Palpation Exquisitely painful to palpation over L common extensor tendon as well as over lateral epicondyle and extending down wrist/finger extensors in dorsal forearm  Repeated Movements Deferred  Passive Accessory Intervertebral Motion Deferred  Accessory Motions/Glides Deferred  Muscle Length Testing Deferred  Special Testing: Deferred  Beighton scale: Deferred   TODAY'S TREATMENT   SUBJECTIVE: Pt reports that his L lateral elbow has remained highly irritable and very painful. He has been performing his HEP without issue. No specific questions upon arrival.   PAIN: L lateral elbow pain   Trigger Point Dry Needling (TDN),  unbilled Education previously performed with patient regarding potential benefit of TDN. Previously reviewed precautions and risks with patient. Pt provided verbal consent to treatment. In seated position using clean technique TDN performed to L wrist extensors (focus on ECRB) with 3, 0.25 x 40 single needle placements (2 perpendicular and 1 threading) with local twitch response (LTR) during both perpendicular placements. Pistoning technique utilized. Deep ache/soreness reported afterwards   Manual Therapy  Extensive STM to L wrist extensors and supinator progressing to common extensor tendon; Left wrist flexion stretch with straight elbow x 30s; Supine L AP radial head mobilizations, grade III, 30s/bout x 3 bouts; Supine L PA radial head mobilizations, grade III, 30s/bout x 3 bouts; Supine L MWM using belt to glide forearm laterally during concurrent gripping x 10; Attempted Mill's manipulation of radial head but pt is too painful at end range elbow extension to tolerate;   Ther-ex  Seated L wrist eccentric extension off edge of table with 1# dumbbell (DB) x 10, 2# x 10; Seated L wrist eccentric radial deviation off edge of table with hammer 2 x 10; Seated L wrist eccentric pronation from vertical to palm down with hammer 2 x 10; HEP updated and reviewed with patient while pt has cold pack on L lateral elbow.   PATIENT EDUCATION:  Education details: Plan of care and updated HEP, Pt educated throughout session about proper posture and technique with exercises. Improved exercise technique, movement at target joints, use of target muscles after min to mod verbal, visual, tactile cues.  Person educated: Patient Education method: Explanation, verbal cues, handout Education comprehension: verbalized understanding   HOME EXERCISE PROGRAM: Access Code: [email or web address] URL: [email or web address] Date: 05/10/2022 Prepared by: Roxana Hires  Exercises - Standing  Wrist Flexion Stretch   - 4-6 x daily - 7 x weekly - 3 reps - 45-60s hold - Tennis Elbow Self Massage (Mirrored)  - 4-6 x daily - 7 x weekly - 3-5 minutes hold - Seated Eccentric Wrist Extension  - 1-2 x daily - 7 x weekly - 2 sets - 10 reps - 5s lowering hold - Forearm Pronation and Supination with Hammer  - 1-2 x daily - 7 x weekly - 2 sets - 10 reps - 5s lowering hold - Standing Wrist Radial Deviation with Hammer  - 1-2 x daily - 7 x weekly - 2 sets - 10 reps - 5s lowering hold  Patient Education - Tennis Elbow - Ice Massage   ASSESSMENT:  CLINICAL IMPRESSION: Patient continues to experience significant L lateral elbow pain which is highly irritable. Initiated manual techniques during session today including radial head mobilizations and soft tissue mobilization. Repeated trigger point dry needling and pt is sore afterwards. Progressed to eccentric strengthening and pt is able to perform with 2# dumbbell as well as hammer with minimal to no pain. HEP updated. Plan to perform progressive tendon loading at future sessions. Recommended wrist brace for patient as tennis elbow strap has offered minimal benefit. Patient will benefit from skilled PT to address pain and weakness in order to improve overall function at home, work, and with leisure activities.  REHAB POTENTIAL: Good  CLINICAL DECISION MAKING: Stable/uncomplicated  EVALUATION COMPLEXITY: Low   GOALS: Goals reviewed with patient? Yes  SHORT TERM GOALS: Target date: 04/16/2022  Pt will be independent with HEP to improve strength and decrease elbow pain to improve pain-free function at home and work. Baseline:  Goal status: INITIAL   LONG TERM GOALS: Target date: 05/14/2022  Pt will increase FOTO to at least 78 to demonstrate significant improvement in function at home and work related to neck pain  Baseline: 03/19/22: 67 Goal status: INITIAL  2.  Pt will decrease worst elbow pain by at least 3 points on the NPRS in order to demonstrate  clinically significant reduction in elbow pain. Baseline: 03/19/22: worst: 7/10; Goal status: INITIAL  3.  Pt will decrease quick DASH score by at least 8% in order to demonstrate clinically significant reduction in disability related to elbow pain        Baseline: 03/19/22: To be completed Goal status: INITIAL  4. Pt will increase pain-free L grip strength to within 10% of RUE in order to demonstrate improvement in strength and function         Baseline: 03/19/22: Grip Strength (avg of two trials): R: 115.3# L (pain-free): 20.5# Goal status: INITIAL   PLAN: PT FREQUENCY: 1-2x/week  PT DURATION: 8 weeks  PLANNED INTERVENTIONS: Therapeutic exercises, Therapeutic activity, Neuromuscular re-education, Balance training, Gait training, Patient/Family education, Joint manipulation, Joint mobilization, Vestibular training, Canalith repositioning, Aquatic Therapy, Dry Needling, Electrical stimulation, Spinal manipulation, Spinal mobilization, Cryotherapy, Moist heat, Traction, Ultrasound, Ionotophoresis '4mg'$ /ml Dexamethasone, and Manual therapy  PLAN FOR NEXT SESSION: Pt to complete QuickDASH, review/modify HEP as needed, progressive strengthening LUE (isometric, eccentric, concentric);  Lyndel Safe Yaquelin Langelier PT, DPT, GCS  Kayna Suppa 05/10/2022, 1:00 PM

## 2022-05-26 NOTE — Therapy (Incomplete)
OUTPATIENT PHYSICAL THERAPY ELBOW TREATMENT  Patient Name: Elijah Logan MRN: [id number] DOB:February 11, 1981, 42 y.o., male Today's Date: 05/26/2022    Past Medical History:  Diagnosis Date   Allergic rhinitis due to pollen    mild   Bulging lumbar disc    Chronic low back pain    Has spina bifida occulta   Heart palpitations    PVCs noted on Event Monitor - some in [place] or couplets.   History of kidney stones    Hyperlipidemia    Moderate obesity    OSA on CPAP    Past Surgical History:  Procedure Laterality Date   COLONOSCOPY WITH PROPOFOL N/A 06/02/2021   Procedure: COLONOSCOPY WITH PROPOFOL;  Surgeon: Lesly Rubenstein, MD;  Location: [facility];  Service: Endoscopy;  Laterality: N/A;   ESOPHAGOGASTRODUODENOSCOPY (EGD) WITH PROPOFOL N/A 06/02/2021   Procedure: ESOPHAGOGASTRODUODENOSCOPY (EGD) WITH PROPOFOL;  Surgeon: Lesly Rubenstein, MD;  Location: [facility] END[facility]COPY;  Service: Endoscopy;  Laterality: N/A;   OPEN ANTERIOR SHOULDER RECONSTRUCTION  1998   Right   TRANSTHORACIC ECHOCARDIOGRAM  12/28/2013   EF 55-60%.  Normal diastolic function   TREADMILL STRESS ECHO  01/02/2014   Ex 10:31 Min; 12.5 METS --> no EKG changes, Normal pre & post Echo Wall Motion. -- No ischemia or infarction   Patient Active Problem List   Diagnosis Date Noted   Epigastric pain 01/13/2021   Rectal bleeding 01/13/2021   GERD (gastroesophageal reflux disease) 06/04/2019   IBS (irritable bowel syndrome) 12/19/2015   PVC's (premature ventricular contractions) 12/07/2013   Obesity, morbid, BMI 40.0-49.9 ([place]) 10/22/2013   Routine general medical examination at a health care facility 09/29/2012   OSA (obstructive sleep apnea) 09/29/2012   Chronic low back pain    Allergic rhinitis due to pollen    PCP: Venia Carbon, MD  REFERRING PROVIDER: Venia Carbon, MD  REFERRING DIAGNOSIS: L elbow pain  THERAPY DIAG: Pain in left elbow  RATIONALE FOR EVALUATION AND TREATMENT:  Rehabilitation  ONSET DATE: 01/01/22 (approximate)  FOLLOW UP APPT WITH PROVIDER: Yes   FROM INITIAL EVALUATION SUBJECTIVE:                                                                                                                                                                                         Chief Complaint: L elbow pain  Pertinent History Pt referred to physical therapy for L elbow pain which started around September of this year. He was working in the yard [employer] and had some straining in his posterolateral L forearm. Pain improved slightly however then worsened again. Since that time pain has remained persistent. He is  noticing weakness in his grip and even has concerns for dropping objects from his L hand when pain hits. He has been using a tennis elbow strap which offers temporary relief while he is wearing it but the pain returns as soon as he removes the brace. He has also tried ice which does offer some relief. Pt experienced a neck injury about a month ago while sleeping however this has been slowly improving. He denies any change in his L elbow pain with neck positions or movements.   Pain:  Pain Intensity: Present: 4/10, Best: 4/10, Worst: 7/10 Pain location: L posterior/lateral forearm near the elbow radiating down the posterior aspect of the forearm.  Pain Quality: burning and throbbing Radiating: Yes down the posterior (dorsal) forearm Numbness/Tingling: Yes, initially he had some numbness in his forearm when pain started but not currently Focal Weakness: Yes, sudden loss of grip strength with severe pain Aggravating factors: gripping, lifting, supination of forearm  Relieving factors: tumeric, ice, tennis elbow strap  24-hour pain behavior: constant History of prior shoulder or neck/shoulder injury, pain, surgery, or therapy: Yes, history of neck and R shoulder pain. No L shoulder or elbow issues. History of similar symptoms in R elbow which  resolved; Dominant hand: right Imaging: No  Prior level of function: Independent Occupational demands: Office job at the computer, pt denies pain with typing on the computer; Hobbies: Golf Red flags (personal history of cancer, chills/fever, night sweats, nausea, vomiting, unrelenting pain): Negative  Precautions: None  Weight Bearing Restrictions: No  Living Environment Lives with: lives with their family Lives in: House/apartment  Patient Goals: Complete all responsibilities at home and work without an increase in pain   OBJECTIVE:   Patient Surveys  FOTO: 74, predicted improvement to 38 QuickDASH: To be completed  Cognition Patient is oriented to person, place, and time.  Recent memory is intact.  Remote memory is intact.  Attention span and concentration are intact.  Expressive speech is intact.  Patient's fund of knowledge is within normal limits for educational level.    Gross Musculoskeletal Assessment Tremor: None Bulk: Normal Tone: Normal  Gait Deferred  Posture No gross deficits identified short of mild forward head posture which he is able to correct with awareness;  Cervical Screen AROM: WFL and painless with overpressure in all planes Spurlings A (ipsilateral lateral flexion/axial compression): R: Negative L: Negative Spurlings B (ipsilateral lateral flexion/contralateral rotation/axial compression): R: Negative L: Negative Repeated movement: Deferred Hoffman Sign (cervical cord compression): R: Not examined L: Not examined ULTT Median: R: Not examined L: Not examined ULTT Ulnar: R: Not examined L: Not examined ULTT Radial: R: Not examined L: Not examined  AROM AROM (Normal range in degrees) AROM 03/19/2022  Cervical  Flexion (50)   Extension (80)   Right lateral flexion (45)   Left lateral flexion (45)   Right rotation (85)   Left rotation (85)    Right Left  Shoulder    Flexion WNL WNL  Extension    Abduction WNL WNL  External  Rotation    Internal Rotation    Hands Behind Head    Hands Behind Back        Elbow    Flexion  WNL  Extension  WNL  Pronation  WNL  Supination  WNL  (* = pain; Blank rows = not tested)   UE MMT: MMT (out of 5) Right 03/19/2022 Left 03/19/2022  Cervical (isometric)  Flexion WNL  Extension WNL  Lateral Flexion WNL WNL  Rotation WNL WNL      Shoulder   Flexion 5 5  Extension    Abduction 5 5  External rotation    Internal rotation    Horizontal abduction    Horizontal adduction    Lower Trapezius    Rhomboids        Elbow  Flexion 5 5  Extension 5 5  Pronation  5  Supination  5      Wrist  Flexion 5 5*  Extension 5 5*  Radial deviation  5*  Ulnar deviation  5      MCP  Flexion 5 5*  Extension 5 5*  Abduction    Adduction    (* = pain; Blank rows = not tested)  Grip Strength (avg): R: 113.7#, 116.8# (115.3#), L (pain-free): 21.5#, 19.4# (20.5#)    Sensation Deferred  Reflexes Deferred  Palpation Exquisitely painful to palpation over L common extensor tendon as well as over lateral epicondyle and extending down wrist/finger extensors in dorsal forearm  Repeated Movements Deferred  Passive Accessory Intervertebral Motion Deferred  Accessory Motions/Glides Deferred  Muscle Length Testing Deferred  Special Testing: Deferred  Beighton scale: Deferred   TODAY'S TREATMENT   SUBJECTIVE: Pt reports that his L lateral elbow has remained highly irritable and very painful. He has been performing his HEP without issue. No specific questions upon arrival.   PAIN: L lateral elbow pain   Trigger Point Dry Needling (TDN), unbilled Education previously performed with patient regarding potential benefit of TDN. Previously reviewed precautions and risks with patient. Pt provided verbal consent to treatment. In seated position using clean technique TDN performed to L wrist extensors (focus on ECRB) with 3, 0.25 x 40 single needle placements (2  perpendicular and 1 threading) with local twitch response (LTR) during both perpendicular placements. Pistoning technique utilized. Deep ache/soreness reported afterwards   Manual Therapy  Extensive STM to L wrist extensors and supinator progressing to common extensor tendon; Left wrist flexion stretch with straight elbow x 30s; Supine L AP radial head mobilizations, grade III, 30s/bout x 3 bouts; Supine L PA radial head mobilizations, grade III, 30s/bout x 3 bouts; Supine L MWM using belt to glide forearm laterally during concurrent gripping x 10; Attempted Mill's manipulation of radial head but pt is too painful at end range elbow extension to tolerate;   Ther-ex  Seated L wrist eccentric extension off edge of table with 1# dumbbell (DB) x 10, 2# x 10; Seated L wrist eccentric radial deviation off edge of table with hammer 2 x 10; Seated L wrist eccentric pronation from vertical to palm down with hammer 2 x 10; HEP updated and reviewed with patient while pt has cold pack on L lateral elbow.   PATIENT EDUCATION:  Education details: Plan of care and updated HEP, Pt educated throughout session about proper posture and technique with exercises. Improved exercise technique, movement at target joints, use of target muscles after min to mod verbal, visual, tactile cues.  Person educated: Patient Education method: Explanation, verbal cues, handout Education comprehension: verbalized understanding   HOME EXERCISE PROGRAM: Access Code: [email or web address] URL: [email or web address] Date: 05/10/2022 Prepared by: Roxana Hires  Exercises - Standing Wrist Flexion Stretch  - 4-6 x daily - 7 x weekly - 3 reps - 45-60s hold - Tennis Elbow Self Massage (Mirrored)  - 4-6 x daily - 7 x weekly - 3-5 minutes hold - Seated Eccentric Wrist Extension  - 1-2 x daily - 7 x weekly - 2 sets -  10 reps - 5s lowering hold - Forearm Pronation and Supination with Hammer  - 1-2 x daily - 7 x weekly - 2 sets -  10 reps - 5s lowering hold - Standing Wrist Radial Deviation with Hammer  - 1-2 x daily - 7 x weekly - 2 sets - 10 reps - 5s lowering hold  Patient Education - Tennis Elbow - Ice Massage   ASSESSMENT:  CLINICAL IMPRESSION: Patient continues to experience significant L lateral elbow pain which is highly irritable. Initiated manual techniques during session today including radial head mobilizations and soft tissue mobilization. Repeated trigger point dry needling and pt is sore afterwards. Progressed to eccentric strengthening and pt is able to perform with 2# dumbbell as well as hammer with minimal to no pain. HEP updated. Plan to perform progressive tendon loading at future sessions. Recommended wrist brace for patient as tennis elbow strap has offered minimal benefit. Patient will benefit from skilled PT to address pain and weakness in order to improve overall function at home, work, and with leisure activities.  REHAB POTENTIAL: Good  CLINICAL DECISION MAKING: Stable/uncomplicated  EVALUATION COMPLEXITY: Low   GOALS: Goals reviewed with patient? Yes  SHORT TERM GOALS: Target date: 04/16/2022  Pt will be independent with HEP to improve strength and decrease elbow pain to improve pain-free function at home and work. Baseline:  Goal status: INITIAL   LONG TERM GOALS: Target date: 05/14/2022  Pt will increase FOTO to at least 78 to demonstrate significant improvement in function at home and work related to neck pain  Baseline: 03/19/22: 67 Goal status: INITIAL  2.  Pt will decrease worst elbow pain by at least 3 points on the NPRS in order to demonstrate clinically significant reduction in elbow pain. Baseline: 03/19/22: worst: 7/10; Goal status: INITIAL  3.  Pt will decrease quick DASH score by at least 8% in order to demonstrate clinically significant reduction in disability related to elbow pain        Baseline: 03/19/22: To be completed Goal status: INITIAL  4. Pt will  increase pain-free L grip strength to within 10% of RUE in order to demonstrate improvement in strength and function         Baseline: 03/19/22: Grip Strength (avg of two trials): R: 115.3# L (pain-free): 20.5# Goal status: INITIAL   PLAN: PT FREQUENCY: 1-2x/week  PT DURATION: 8 weeks  PLANNED INTERVENTIONS: Therapeutic exercises, Therapeutic activity, Neuromuscular re-education, Balance training, Gait training, Patient/Family education, Joint manipulation, Joint mobilization, Vestibular training, Canalith repositioning, Aquatic Therapy, Dry Needling, Electrical stimulation, Spinal manipulation, Spinal mobilization, Cryotherapy, Moist heat, Traction, Ultrasound, Ionotophoresis '4mg'$ /ml Dexamethasone, and Manual therapy  PLAN FOR NEXT SESSION: Pt to complete QuickDASH, review/modify HEP as needed, progressive strengthening LUE (isometric, eccentric, concentric);  Lyndel Safe Nia Nathaniel PT, DPT, GCS  Valoria Tamburri 05/26/2022, 1:07 PM

## 2022-05-28 ENCOUNTER — Ambulatory Visit: Payer: No Typology Code available for payment source

## 2022-05-28 DIAGNOSIS — M25522 Pain in left elbow: Secondary | ICD-10-CM

## 2022-06-10 NOTE — Therapy (Signed)
OUTPATIENT PHYSICAL THERAPY ELBOW TREATMENT/RECERTIFICATION  Patient Name: Elijah Logan MRN: [id number] DOB:1980/11/18, 42 y.o., male Today's Date: 06/11/2022   PT End of Session - 06/11/22 0800     Visit Number 3    Number of Visits 33    Date for PT Re-Evaluation 08/06/22    Authorization Type eval: [id number], recert: [id number];    PT Start Time 0758    PT Stop Time 0843    PT Time Calculation (min) 45 min    Activity Tolerance Patient limited by pain    Behavior During Therapy [facility] for tasks assessed/performed            Past Medical History:  Diagnosis Date   Allergic rhinitis due to pollen    mild   Bulging lumbar disc    Chronic low back pain    Has spina bifida occulta   Heart palpitations    PVCs noted on Event Monitor - some in Bigeminy or couplets.   History of kidney stones    Hyperlipidemia    Moderate obesity    OSA on CPAP    Past Surgical History:  Procedure Laterality Date   COLONOSCOPY WITH PROPOFOL N/A 06/02/2021   Procedure: COLONOSCOPY WITH PROPOFOL;  Surgeon: Lesly Rubenstein, MD;  Location: [facility] ENDOSCOPY;  Service: Endoscopy;  Laterality: N/A;   ESOPHAGOGASTRODUODENOSCOPY (EGD) WITH PROPOFOL N/A 06/02/2021   Procedure: ESOPHAGOGASTRODUODENOSCOPY (EGD) WITH PROPOFOL;  Surgeon: Lesly Rubenstein, MD;  Location: [facility] ENDOSCOPY;  Service: Endoscopy;  Laterality: N/A;   OPEN ANTERIOR SHOULDER RECONSTRUCTION  1998   Right   TRANSTHORACIC ECHOCARDIOGRAM  12/28/2013   EF 55-60%.  Normal diastolic function   TREADMILL STRESS ECHO  01/02/2014   Ex 10:31 Min; 12.5 METS --> no EKG changes, Normal pre & post Echo Wall Motion. -- No ischemia or infarction   Patient Active Problem List   Diagnosis Date Noted   Epigastric pain 01/13/2021   Rectal bleeding 01/13/2021   GERD (gastroesophageal reflux disease) 06/04/2019   IBS (irritable bowel syndrome) 12/19/2015   PVC's (premature ventricular contractions) 12/07/2013   Obesity, morbid, BMI 40.0-49.9 ([place])  10/22/2013   Routine general medical examination at a health care facility 09/29/2012   OSA (obstructive sleep apnea) 09/29/2012   Chronic low back pain    Allergic rhinitis due to pollen    PCP: Venia Carbon, MD  REFERRING PROVIDER: Earleen Newport, MD  REFERRING DIAGNOSIS: L elbow pain  THERAPY DIAG: Pain in left elbow  RATIONALE FOR EVALUATION AND TREATMENT: Rehabilitation  ONSET DATE: 01/01/22 (approximate)  FOLLOW UP APPT WITH PROVIDER: Yes   FROM INITIAL EVALUATION SUBJECTIVE:  Chief Complaint: L elbow pain  Pertinent History Pt referred to physical therapy for L elbow pain which started around September of this year. He was working in the yard [employer] and had some straining in his posterolateral L forearm. Pain improved slightly however then worsened again. Since that time pain has remained persistent. He is noticing weakness in his grip and even has concerns for dropping objects from his L hand when pain hits. He has been using a tennis elbow strap which offers temporary relief while he is wearing it but the pain returns as soon as he removes the brace. He has also tried ice which does offer some relief. Pt experienced a neck injury about a month ago while sleeping however this has been slowly improving. He denies any change in his L elbow pain with neck positions or movements.   Pain:  Pain Intensity: Present: 4/10, Best: 4/10, Worst: 7/10 Pain location: L posterior/lateral forearm near the elbow radiating down the posterior aspect of the forearm.  Pain Quality: burning and throbbing Radiating: Yes down the posterior (dorsal) forearm Numbness/Tingling: Yes, initially he had some numbness in his forearm when pain started but not currently Focal Weakness: Yes, sudden loss of grip  strength with severe pain Aggravating factors: gripping, lifting, supination of forearm  Relieving factors: tumeric, ice, tennis elbow strap  24-hour pain behavior: constant History of prior shoulder or neck/shoulder injury, pain, surgery, or therapy: Yes, history of neck and R shoulder pain. No L shoulder or elbow issues. 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  Recent memory is intact.  Remote memory is intact.  Attention span and concentration are intact.  Expressive speech is intact.  Patient's fund of knowledge is within normal limits for educational level.    Gross Musculoskeletal Assessment Tremor: None Bulk: Normal Tone: Normal  Gait Deferred  Posture No gross deficits identified short of mild forward head posture which he is able to correct with awareness;  Cervical Screen AROM: WFL and painless with overpressure in all planes Spurlings A (ipsilateral lateral flexion/axial compression): R: Negative L: Negative Spurlings B (ipsilateral lateral flexion/contralateral rotation/axial compression): R: Negative L: Negative Repeated movement: Deferred Hoffman Sign (cervical cord compression): R: Not examined L: Not examined ULTT Median: R: Not examined L: Not examined ULTT Ulnar: R: Not  examined L: Not examined ULTT Radial: R: Not examined L: Not examined  AROM AROM (Normal range in degrees) AROM 03/19/2022  Cervical  Flexion (50)   Extension (80)   Right lateral flexion (45)   Left lateral flexion (45)   Right rotation (85)   Left rotation (85)    Right Left  Shoulder    Flexion WNL WNL  Extension    Abduction WNL WNL  External Rotation    Internal Rotation    Hands Behind Head    Hands Behind Back  Elbow    Flexion  WNL  Extension  WNL  Pronation  WNL  Supination  WNL  (* = pain; Blank rows = not tested)  UE MMT: MMT (out of 5) Right 03/19/2022 Left 03/19/2022  Cervical (isometric)  Flexion WNL  Extension WNL  Lateral Flexion WNL WNL  Rotation WNL WNL      Shoulder   Flexion 5 5  Extension    Abduction 5 5  External rotation    Internal rotation    Horizontal abduction    Horizontal adduction    Lower Trapezius    Rhomboids        Elbow  Flexion 5 5  Extension 5 5  Pronation  5  Supination  5      Wrist  Flexion 5 5*  Extension 5 5*  Radial deviation  5*  Ulnar deviation  5      MCP  Flexion 5 5*  Extension 5 5*  Abduction    Adduction    (* = pain; Blank rows = not tested)  Grip Strength (avg): R: 113.7#, 116.8# (115.3#), L (pain-free): 21.5#, 19.4# (20.5#)    Sensation Deferred  Reflexes Deferred  Palpation Exquisitely painful to palpation over L common extensor tendon as well as over lateral epicondyle and extending down wrist/finger extensors in dorsal forearm  Repeated Movements Deferred  Passive Accessory Intervertebral Motion Deferred  Accessory Motions/Glides Deferred  Muscle Length Testing Deferred  Special Testing: Deferred  Beighton scale: Deferred   TODAY'S TREATMENT    SUBJECTIVE: Pt reports that his L lateral elbow has improved by about 30% since the last therapy session. He has been wearing his wrist brace while working which has been helping.    PAIN: L lateral elbow  pain   Trigger Point Dry Needling (TDN), unbilled Education previously performed with patient regarding potential benefit of TDN. Previously reviewed precautions and risks with patient. Pt provided verbal consent to treatment. In seated position using clean technique TDN performed to L wrist extensors (focus on ECRB) with 4, 0.25 x 40 single needle placements (all perpendicular) with local twitch response (LTR). Pistoning technique utilized. Deep ache/soreness reported;   Manual Therapy  Updated outcome measures: FOTO: 60 QuickDASH: 29.5# Worst pain: 6/10; Pain-free grip strength: 46.3#  Extensive IASTM to L wrist extensors and supinator progressing to common extensor tendon; Left wrist flexion stretch with straight elbow x 30s;   Ther-ex  Seated isotonic L wrist extension off edge of table with 2# dumbbell (DB) 3 x 10; Seated isotonic L wrist radial deviation off edge of table with hammer 3 x 10; Seated isotonic L wrist pronation/supination with hammer 3 x 10; HEP updated and reviewed with patient while pt has cold pack on L lateral elbow;   PATIENT EDUCATION:  Education details: Plan of care and updated HEP, Pt educated throughout session about proper posture and technique with exercises. Improved exercise technique, movement at target joints, use of target muscles after min to mod verbal, visual, tactile cues.  Person educated: Patient Education method: Explanation, verbal cues, handout Education comprehension: verbalized understanding   HOME EXERCISE PROGRAM: Access Code: [email or web address] URL: [email or web address] Date: 06/11/2022 Prepared by: Roxana Hires  Exercises - Tennis Elbow Self Massage (Mirrored)  - 4-6 x daily - 7 x weekly - 3-5 minutes hold - Standing Wrist Flexion Stretch  - 4-6 x daily - 7 x weekly - 3 reps - 45-60s hold - Forearm Pronation and Supination with Hammer  - 1-2 x daily - 7 x  weekly - 2-3 sets - 10 reps - Standing Wrist Radial Deviation  with Hammer  - 1-2 x daily - 7 x weekly - 2-3 sets - 10 reps - Seated Wrist Extension with Dumbbell (Mirrored)  - 1-2 x daily - 7 x weekly - 2-3 sets - 10 reps  Patient Education - Tennis Elbow - Ice Massage   ASSESSMENT:  CLINICAL IMPRESSION: Pt reporting approximately 30% improvement in symptoms since starting with therapy. Updated outcome measures with patient during visit today. QuickDASH is 29.5% and FOTO is 60. His pain-free gripping has improved from 20.5# to 46.3# today. His worst elbow pain has decreased from 7/10 initially to 5-6/10 today. Patient continues to experience significant L lateral elbow pain but has noticed improvement when wearing his wrist brace. Continued manual techniques during session today and repeated trigger point dry needling. Progressed to isotonic strengthening and pt is able to perform with 2# dumbbell as well as hammer with no pain. HEP updated and education provided. Plan to perform progressive tendon loading at future sessions. Patient will benefit from skilled PT to address pain and weakness in order to improve overall function at home, work, and with leisure activities.  REHAB POTENTIAL: Good  CLINICAL DECISION MAKING: Stable/uncomplicated  EVALUATION COMPLEXITY: Low   GOALS: Goals reviewed with patient? Yes  SHORT TERM GOALS: Target date: 07/09/2022   Pt will be independent with HEP to improve strength and decrease elbow pain to improve pain-free function at home and work. Baseline:  Goal status: ONGOING   LONG TERM GOALS: Target date: 08/06/2022   Pt will increase FOTO to at least 78 to demonstrate significant improvement in function at home and work related to neck pain  Baseline: 03/19/22: 67; 06/11/22: 60 Goal status: ONGOING  2.  Pt will decrease worst elbow pain by at least 3 points on the NPRS in order to demonstrate clinically significant reduction in elbow pain. Baseline: 03/19/22: worst: 7/10; 06/11/22: worst: 5-6/10; Goal status:  PARTIALLY MET  3.  Pt will decrease quick DASH score by at least 8% in order to demonstrate clinically significant reduction in disability related to elbow pain        Baseline: 03/19/22: To be completed; 06/11/22: 29.5% Goal status: ONGOING  4. Pt will increase pain-free L grip strength to within 10% of RUE in order to demonstrate improvement in strength and function         Baseline: 03/19/22: Grip Strength (avg of two trials): R: 115.3# L (pain-free): 20.5#; 06/11/22: 46.3# Goal status: PARTIALLY MET   PLAN: PT FREQUENCY: 1-2x/week  PT DURATION: 8 weeks  PLANNED INTERVENTIONS: Therapeutic exercises, Therapeutic activity, Neuromuscular re-education, Balance training, Gait training, Patient/Family education, Joint manipulation, Joint mobilization, Vestibular training, Canalith repositioning, Aquatic Therapy, Dry Needling, Electrical stimulation, Spinal manipulation, Spinal mobilization, Cryotherapy, Moist heat, Traction, Ultrasound, Ionotophoresis 2m/ml Dexamethasone, and Manual therapy  PLAN FOR NEXT SESSION: review/modify HEP as needed, manual therapy and progressive strengthening LUE;  Tedford Berg D Shaneece Stockburger PT, DPT, GCS  Jacquese Cassarino 06/11/2022, 10:45 AM

## 2022-06-11 ENCOUNTER — Ambulatory Visit: Payer: No Typology Code available for payment source | Attending: Emergency Medicine

## 2022-06-11 DIAGNOSIS — M25522 Pain in left elbow: Secondary | ICD-10-CM | POA: Diagnosis present

## 2022-06-15 ENCOUNTER — Ambulatory Visit: Payer: No Typology Code available for payment source | Attending: Emergency Medicine

## 2022-07-31 ENCOUNTER — Encounter: Payer: Self-pay | Admitting: Intensive Care

## 2022-07-31 ENCOUNTER — Other Ambulatory Visit: Payer: Self-pay

## 2022-07-31 ENCOUNTER — Emergency Department
Admission: EM | Admit: 2022-07-31 | Discharge: 2022-07-31 | Disposition: A | Discharge status: Home / Self Care | Payer: No Typology Code available for payment source | Attending: Emergency Medicine | Admitting: Emergency Medicine

## 2022-07-31 ENCOUNTER — Emergency Department: Payer: No Typology Code available for payment source

## 2022-07-31 DIAGNOSIS — K429 Umbilical hernia without obstruction or gangrene: Secondary | ICD-10-CM

## 2022-07-31 DIAGNOSIS — R109 Unspecified abdominal pain: Secondary | ICD-10-CM | POA: Diagnosis present

## 2022-07-31 LAB — COMPREHENSIVE METABOLIC PANEL
ALT: 28 U/L (ref 0–44)
AST: 17 U/L (ref 15–41)
Albumin: 4 g/dL (ref 3.5–5.0)
Alkaline Phosphatase: 65 U/L (ref 38–126)
Anion gap: 10 (ref 5–15)
BUN: 15 mg/dL (ref 6–20)
CO2: 25 mmol/L (ref 22–32)
Calcium: 9.3 mg/dL (ref 8.9–10.3)
Chloride: 106 mmol/L (ref 98–111)
Creatinine, Ser: 1.08 mg/dL (ref 0.61–1.24)
GFR, Estimated: >60 mL/min (ref >60)
Glucose, Bld: 110 mg/dL — ABNORMAL HIGH (ref 70–99)
Potassium: 4.1 mmol/L (ref 3.5–5.1)
Sodium: 141 mmol/L (ref 135–145)
Total Bilirubin: 0.7 mg/dL (ref 0.3–1.2)
Total Protein: 7.6 g/dL (ref 6.5–8.1)

## 2022-07-31 LAB — CBC
HCT: 44.5 % (ref 39.0–52.0)
Hemoglobin: 14.7 g/dL (ref 13.0–17.0)
MCH: 29.2 pg (ref 26.0–34.0)
MCHC: 33 g/dL (ref 30.0–36.0)
MCV: 88.3 fL (ref 80.0–100.0)
Platelets: 231 10*3/uL (ref 150–400)
RBC: 5.04 MIL/uL (ref 4.22–5.81)
RDW: 13 % (ref 11.5–15.5)
WBC: 9.7 10*3/uL (ref 4.0–10.5)
nRBC: 0 % (ref 0.0–0.2)

## 2022-07-31 LAB — LIPASE, BLOOD: Lipase: 25 U/L (ref 11–51)

## 2022-07-31 MED ORDER — IOHEXOL 350 MG/ML SOLN
125.0000 mL | Freq: Once | INTRAVENOUS | Status: AC | PRN
  Administered 2022-07-31: 125 mL via INTRAVENOUS

## 2022-07-31 MED ORDER — KETOROLAC TROMETHAMINE 30 MG/ML IJ SOLN
15.0000 mg | Freq: Once | INTRAMUSCULAR | Status: AC
  Administered 2022-07-31: 15 mg via INTRAVENOUS
  Filled 2022-07-31: qty 1

## 2022-07-31 NOTE — ED Triage Notes (Signed)
Patient reports umbilical pain that started this AM. At first the pain was intermittent and has become constant. Reports when he felt in his umbilical today it felt like a water balloon

## 2022-07-31 NOTE — Discharge Instructions (Addendum)
You have an umbilical hernia.  Please follow-up with general surgery.  If your pain is severe and unrelenting or you develop nausea vomiting or not having bowel movements and please return to the emergency department.

## 2022-07-31 NOTE — ED Provider Notes (Signed)
[facility] Provider Note    Event Date/Time   First MD Initiated Contact with Patient 07/31/22 1532     (approximate)   History   Abdominal Pain   HPI Elijah Logan is a 42 y.o. male past medical history of obesity, hyperlipidemia, kidney stones who presents with abdominal pain.  Patient coughed today and felt sharp pain in the umbilicus.  Continue to have pain with movement and with bending and with coughing.  Pain started to intensify is now radiating laterally in bilateral directions.  No associated nausea vomiting urinary symptoms or constipation.  Last BM today.  He palpated his umbilicus and felt like there was a water bag in the area.  Denies any history of known hernia.  No history of similar pain.     Past Medical History:  Diagnosis Date   Allergic rhinitis due to pollen    mild   Bulging lumbar disc    Chronic low back pain    Has spina bifida occulta   Heart palpitations    PVCs noted on Event Monitor - some in [place] or couplets.   History of kidney stones    Hyperlipidemia    Moderate obesity    OSA on CPAP     Patient Active Problem List   Diagnosis Date Noted   Epigastric pain 01/13/2021   Rectal bleeding 01/13/2021   GERD (gastroesophageal reflux disease) 06/04/2019   IBS (irritable bowel syndrome) 12/19/2015   PVC's (premature ventricular contractions) 12/07/2013   Obesity, morbid, BMI 40.0-49.9 ([place]) 10/22/2013   Routine general medical examination at a health care facility 09/29/2012   OSA (obstructive sleep apnea) 09/29/2012   Chronic low back pain    Allergic rhinitis due to pollen      Physical Exam  Triage Vital Signs: ED Triage Vitals  Enc Vitals Group     BP --      Pulse Rate 07/31/22 1526 84     Resp 07/31/22 1526 20     Temp 07/31/22 1526 98 F (36.7 C)     Temp Source 07/31/22 1526 Oral     SpO2 07/31/22 1526 94 %     Weight 07/31/22 1527 (!) 325 lb (147.4 kg)     Height 07/31/22 1527 5' 10.5" (1.791  m)     Head Circumference --      Peak Flow --      Pain Score 07/31/22 1527 6     Pain Loc --      Pain Edu? --      Excl. in [place]? --     Most recent vital signs: Vitals:   07/31/22 1526 07/31/22 1718  BP:  133/77  Pulse: 84 68  Resp: 20 20  Temp: 98 F (36.7 C) 98.2 F (36.8 C)  SpO2: 94% 95%     General: Awake, no distress.  CV:  Good peripheral perfusion.  Resp:  Normal effort.  Abd:  No distention.  Obese abdomen, mild tenderness in periumbilical region, there is a very small reducible umbilical hernia Neuro:             Awake, Alert, Oriented x 3  Other:     ED Results / Procedures / Treatments  Labs (all labs ordered are listed, but only abnormal results are displayed) Labs Reviewed  COMPREHENSIVE METABOLIC PANEL - Abnormal; Notable for the following components:      Result Value   Glucose, Bld 110 (*)    All other components within  normal limits  LIPASE, BLOOD  CBC     EKG     RADIOLOGY    PROCEDURES:  Critical Care performed: No  Procedures   MEDICATIONS ORDERED IN ED: Medications  ketorolac (TORADOL) 30 MG/ML injection 15 mg (15 mg Intravenous Given 07/31/22 1610)  iohexol (OMNIPAQUE) 350 MG/ML injection 125 mL (125 mLs Intravenous Contrast Given 07/31/22 1639)     IMPRESSION / MDM / ASSESSMENT AND PLAN / ED COURSE  I reviewed the triage vital signs and the nursing notes.                              Patient's presentation is most consistent with acute complicated illness / injury requiring diagnostic workup.  Differential diagnosis includes, but is not limited to, local hernia, incarcerated or strangulated hernia, low suspicion for appendicitis, diverticulitis,  Patient is a 42 year old male who presents with pain around the umbilicus.  Started with coughing today and he felt like there was a bag of water in the umbilical region.  Pain has been coming and going since then worse with coughing bending over and with movement.  No  associated nausea vomiting constipation or diarrhea.  On abdominal exam he has mild periumbilical tenderness but no guarding abdomen is obese.  Does have a feels like a very small umbilical hernia that was very tender to touch but is reducible and soft without skin change.  Do think his pain is likely coming from hernia.  Despite it being reducible he still has significant pain with palpation that area so we will obtain a CT of the abdomen pelvis to further characterize.  Labs including CBC CMP and lipase are all reassuring.  Patient does not want any opiates at this time.  Will give Toradol for pain.  CT abdomen pelvis obtained does show a small fat-containing umbilical hernia.  There are some findings that she has incarceration.  On repeat exam patient feels much more comfortable.  The hernia continues to be soft mildly tender but it is fully reducible.  Does seem to pop right back out but currently it is not incarcerated as I am able to easily reduce it.  I do think that he would benefit from seeing surgery as an outpatient given he is symptomatic.  Recommended NSAIDs for any intermittent pain.  Discussed with patient that if pain is severe and unrelenting or if he develops any obstructive symptoms that he return to ED.  I have given him general surgery follow-up.       FINAL CLINICAL IMPRESSION(S) / ED DIAGNOSES   Final diagnoses:  Umbilical hernia without obstruction and without gangrene     Rx / DC Orders   ED Discharge Orders     None        Note:  This document was prepared using Dragon voice recognition software and may include unintentional dictation errors.   Rada Hay, MD 07/31/22 2329

## 2022-07-31 NOTE — ED Triage Notes (Signed)
First Nurse Note:  C/O umbilical pain, onset today.  States pain progressively worsening today.  Describes as a pressure, "Like a water balloon".

## 2022-08-03 ENCOUNTER — Ambulatory Visit: Payer: Self-pay | Admitting: Surgery

## 2022-08-03 NOTE — H&P (Signed)
Subjective:   CC: Umbilical hernia with obstruction, without gangrene [K42.0]  HPI:  Elijah Logan is a 42 y.o. male who was referred by Wardell Honour, NP for evaluation of above. Symptoms were first noted a few days ago. Pain is sharp and improving but still present, confined to the periumbilical area, without radiation.  Associated with nothing, exacerbated by movement.  Lump is reducible.    Past Medical History:  has no past medical history on file.  Past Surgical History:  Past Surgical History:  Procedure Laterality Date   COLONOSCOPY N/A 06/02/2021   Dr. Zada Girt @  [place] - Multi. Adenomatous Polyps, rpt 1 yr per CTL  (05/06/22 Recall rtrn.awb)   EGD N/A 06/02/2021   Dr. Zada Girt @ [facility] - Normal/Repeat as needed/CTL    Family History: family history includes Colon polyps in his father and mother.  Social History:  reports that he has never smoked. He has never used smokeless tobacco. He reports current alcohol use. No history on file for drug use.  Current Medications: has a current medication list which includes the following prescription(s): famotidine, turmeric, pantoprazole, and sodium, potassium, and magnesium.  Allergies:  Allergies as of 08/02/2022   (No Known Allergies)    ROS:  A 15 point review of systems was performed and pertinent positives and negatives noted in HPI   Objective:     BP (!) 156/95   Pulse 85   Ht 177.8 cm (5\' 10" )   Wt (!) 156 kg (344 lb)   BMI 49.36 kg/m   Constitutional :  Alert, cooperative, no distress  Lymphatics/Throat:  Supple, no lymphadenopathy  Respiratory:  clear to auscultation bilaterally  Cardiovascular:  regular rate and rhythm  Gastrointestinal: soft, non-tender; bowel sounds normal; no masses,  no organomegaly. umbilical hernia noted.  small  Musculoskeletal: Steady gait and movement  Skin: Cool and moist  Psychiatric: Normal affect, non-agitated, not confused       LABS:  N/a   RADS: CLINICAL  DATA:  Abdominal pain, acute, nonlocalized periumbilical  abdominal pain, umbilical hernia   EXAM:  CT ABDOMEN AND PELVIS WITH CONTRAST   TECHNIQUE:  Multidetector CT imaging of the abdomen and pelvis was performed  using the standard protocol following bolus administration of  intravenous contrast.   RADIATION DOSE REDUCTION: This exam was performed according to the  departmental dose-optimization program which includes automated  exposure control, adjustment of the mA and/or kV according to  patient size and/or use of iterative reconstruction technique.   CONTRAST:  17mL OMNIPAQUE IOHEXOL 350 MG/ML SOLN   COMPARISON:  Noncontrast CT 11/30/2016, right upper quadrant  ultrasound 01/29/2021   FINDINGS:  Lower chest: The lung bases are clear.   Hepatobiliary: The liver is prominent size spanning 20.8 cm cranial  caudal with diffuse steatosis. No focal liver abnormalities.  Gallstone on prior ultrasound is not well seen by CT. No abnormal  gallbladder distension, pericholecystic fat stranding or  inflammation. No biliary dilatation.   Pancreas: No ductal dilatation or inflammation.   Spleen: Again seen splenomegaly, the spleen spans 16.6 cm AP. No  focal splenic abnormalities.   Adrenals/Urinary Tract: Normal adrenal glands. No hydronephrosis. 3  mm stone in the mid right kidney, nonobstructing. No focal renal  lesion. Decompressed ureters. Unremarkable urinary bladder.   Stomach/Bowel: The stomach unremarkable. No bowel obstruction or  inflammatory change. Normal appendix visualized. Small to moderate  volume of stool in the colon. Scattered colonic diverticula  primarily involving the  left colon. No diverticulitis. No colonic  inflammation.   Vascular/Lymphatic: Normal caliber abdominal aorta. Patent portal  vein. No acute vascular findings. No abdominopelvic adenopathy.   Reproductive: Prostate is normal in size. There are prostatic  calcifications.   Other: Small  fat containing umbilical hernia, the hernia neck  measures 7 mm. Hernia contains intra-abdominal fat. Stranding of the  herniated fat with trace free fluid in the hernia sac. No bowel  involvement. No ascites or free air.   Musculoskeletal: Chronic retrolisthesis of L5 on S1 with vacuum  phenomenon. There are no acute or suspicious osseous abnormalities.   IMPRESSION:  1. Small fat containing umbilical hernia with stranding of the  herniated fat and trace free fluid in the hernia sac, suspicious for  incarceration. No bowel involvement.  2. Nonobstructing right nephrolithiasis.  3. Colonic diverticulosis without diverticulitis.  4. Hepatosplenomegaly and hepatic steatosis.    Electronically Signed    By: Keith Rake M.D.    On: 07/31/2022 16:59  Assessment:       Umbilical hernia with obstruction, without gangrene [K42.0]  Plan:     1. Umbilical hernia with obstruction, without gangrene [K42.0]   Discussed the risk of surgery including recurrence, which can be up to 50% in the case of incisional or complex hernias, possible use of prosthetic materials (mesh) and the increased risk of mesh infxn if used, bleeding, chronic pain, post-op infxn, post-op SBO or ileus, and possible re-operation to address said risks. The risks of general anesthetic, if used, includes MI, CVA, sudden death or even reaction to anesthetic medications also discussed. Alternatives include continued observation.  Benefits include possible symptom relief, prevention of incarceration, strangulation, enlargement in size over time, and the risk of emergency surgery in the face of strangulation.   Typical post-op recovery time of 3-5 days with 2 weeks of activity restrictions were also discussed.  ED return precautions given for sudden increase in pain, size of hernia with accompanying fever, nausea, and/or vomiting.  The patient verbalized understanding and all questions were answered to the patient's  satisfaction.   2. Patient has elected to proceed with surgical treatment. Procedure will be scheduled. Open due to small size, likely mesh though due to excess weight  labs/images/medications/previous chart entries reviewed personally and relevant changes/updates noted above.

## 2022-08-03 NOTE — H&P (View-Only) (Signed)
Subjective:   CC: Umbilical hernia with obstruction, without gangrene [K42.0]  HPI:  Elijah Logan is a 42 y.o. male who was referred by Vicki Sugarman, NP for evaluation of above. Symptoms were first noted a few days ago. Pain is sharp and improving but still present, confined to the periumbilical area, without radiation.  Associated with nothing, exacerbated by movement.  Lump is reducible.    Past Medical History:  has no past medical history on file.  Past Surgical History:  Past Surgical History:  Procedure Laterality Date   COLONOSCOPY N/A 06/02/2021   Dr. C. T. Locklear @  [facility] - Multi. Adenomatous Polyps, rpt 1 yr per CTL  (05/06/22 Recall rtrn.awb)   EGD N/A 06/02/2021   Dr. C. T. Locklear @ [facility] - Normal/Repeat as needed/CTL    Family History: family history includes Colon polyps in his father and mother.  Social History:  reports that he has never smoked. He has never used smokeless tobacco. He reports current alcohol use. No history on file for drug use.  Current Medications: has a current medication list which includes the following prescription(s): famotidine, turmeric, pantoprazole, and sodium, potassium, and magnesium.  Allergies:  Allergies as of 08/02/2022   (No Known Allergies)    ROS:  A 15 point review of systems was performed and pertinent positives and negatives noted in HPI   Objective:     BP (!) 156/95   Pulse 85   Ht 177.8 cm (5' 10")   Wt (!) 156 kg (344 lb)   BMI 49.36 kg/m   Constitutional :  Alert, cooperative, no distress  Lymphatics/Throat:  Supple, no lymphadenopathy  Respiratory:  clear to auscultation bilaterally  Cardiovascular:  regular rate and rhythm  Gastrointestinal: soft, non-tender; bowel sounds normal; no masses,  no organomegaly. umbilical hernia noted.  small  Musculoskeletal: Steady gait and movement  Skin: Cool and moist  Psychiatric: Normal affect, non-agitated, not confused       LABS:  N/a   RADS: CLINICAL  DATA:  Abdominal pain, acute, nonlocalized periumbilical  abdominal pain, umbilical hernia   EXAM:  CT ABDOMEN AND PELVIS WITH CONTRAST   TECHNIQUE:  Multidetector CT imaging of the abdomen and pelvis was performed  using the standard protocol following bolus administration of  intravenous contrast.   RADIATION DOSE REDUCTION: This exam was performed according to the  departmental dose-optimization program which includes automated  exposure control, adjustment of the mA and/or kV according to  patient size and/or use of iterative reconstruction technique.   CONTRAST:  125mL OMNIPAQUE IOHEXOL 350 MG/ML SOLN   COMPARISON:  Noncontrast CT 11/30/2016, right upper quadrant  ultrasound 01/29/2021   FINDINGS:  Lower chest: The lung bases are clear.   Hepatobiliary: The liver is prominent size spanning 20.8 cm cranial  caudal with diffuse steatosis. No focal liver abnormalities.  Gallstone on prior ultrasound is not well seen by CT. No abnormal  gallbladder distension, pericholecystic fat stranding or  inflammation. No biliary dilatation.   Pancreas: No ductal dilatation or inflammation.   Spleen: Again seen splenomegaly, the spleen spans 16.6 cm AP. No  focal splenic abnormalities.   Adrenals/Urinary Tract: Normal adrenal glands. No hydronephrosis. 3  mm stone in the mid right kidney, nonobstructing. No focal renal  lesion. Decompressed ureters. Unremarkable urinary bladder.   Stomach/Bowel: The stomach unremarkable. No bowel obstruction or  inflammatory change. Normal appendix visualized. Small to moderate  volume of stool in the colon. Scattered colonic diverticula  primarily involving the   left colon. No diverticulitis. No colonic  inflammation.   Vascular/Lymphatic: Normal caliber abdominal aorta. Patent portal  vein. No acute vascular findings. No abdominopelvic adenopathy.   Reproductive: Prostate is normal in size. There are prostatic  calcifications.   Other: Small  fat containing umbilical hernia, the hernia neck  measures 7 mm. Hernia contains intra-abdominal fat. Stranding of the  herniated fat with trace free fluid in the hernia sac. No bowel  involvement. No ascites or free air.   Musculoskeletal: Chronic retrolisthesis of L5 on S1 with vacuum  phenomenon. There are no acute or suspicious osseous abnormalities.   IMPRESSION:  1. Small fat containing umbilical hernia with stranding of the  herniated fat and trace free fluid in the hernia sac, suspicious for  incarceration. No bowel involvement.  2. Nonobstructing right nephrolithiasis.  3. Colonic diverticulosis without diverticulitis.  4. Hepatosplenomegaly and hepatic steatosis.    Electronically Signed    By: Melanie  Sanford M.D.    On: 07/31/2022 16:59  Assessment:       Umbilical hernia with obstruction, without gangrene [K42.0]  Plan:     1. Umbilical hernia with obstruction, without gangrene [K42.0]   Discussed the risk of surgery including recurrence, which can be up to 50% in the case of incisional or complex hernias, possible use of prosthetic materials (mesh) and the increased risk of mesh infxn if used, bleeding, chronic pain, post-op infxn, post-op SBO or ileus, and possible re-operation to address said risks. The risks of general anesthetic, if used, includes MI, CVA, sudden death or even reaction to anesthetic medications also discussed. Alternatives include continued observation.  Benefits include possible symptom relief, prevention of incarceration, strangulation, enlargement in size over time, and the risk of emergency surgery in the face of strangulation.   Typical post-op recovery time of 3-5 days with 2 weeks of activity restrictions were also discussed.  ED return precautions given for sudden increase in pain, size of hernia with accompanying fever, nausea, and/or vomiting.  The patient verbalized understanding and all questions were answered to the patient's  satisfaction.   2. Patient has elected to proceed with surgical treatment. Procedure will be scheduled. Open due to small size, likely mesh though due to excess weight  labs/images/medications/previous chart entries reviewed personally and relevant changes/updates noted above.   

## 2022-08-09 ENCOUNTER — Encounter
Admission: RE | Disposition: A | Discharge status: Home / Self Care | Payer: Self-pay | Source: Home / Self Care | Attending: Gastroenterology

## 2022-08-09 ENCOUNTER — Ambulatory Visit: Payer: No Typology Code available for payment source | Admitting: Anesthesiology

## 2022-08-09 ENCOUNTER — Ambulatory Visit
Admission: RE | Admit: 2022-08-09 | Discharge: 2022-08-09 | Disposition: A | Discharge status: Home / Self Care | Payer: No Typology Code available for payment source | Attending: Gastroenterology | Admitting: Gastroenterology

## 2022-08-09 ENCOUNTER — Encounter: Payer: Self-pay | Admitting: Anesthesiology

## 2022-08-09 DIAGNOSIS — K648 Other hemorrhoids: Secondary | ICD-10-CM | POA: Diagnosis not present

## 2022-08-09 DIAGNOSIS — D128 Benign neoplasm of rectum: Secondary | ICD-10-CM | POA: Insufficient documentation

## 2022-08-09 DIAGNOSIS — M25522 Pain in left elbow: Secondary | ICD-10-CM | POA: Diagnosis not present

## 2022-08-09 DIAGNOSIS — K640 First degree hemorrhoids: Secondary | ICD-10-CM | POA: Diagnosis not present

## 2022-08-09 DIAGNOSIS — K429 Umbilical hernia without obstruction or gangrene: Secondary | ICD-10-CM | POA: Insufficient documentation

## 2022-08-09 DIAGNOSIS — Q760 Spina bifida occulta: Secondary | ICD-10-CM | POA: Insufficient documentation

## 2022-08-09 DIAGNOSIS — Z6841 Body Mass Index (BMI) 40.0 and over, adult: Secondary | ICD-10-CM | POA: Diagnosis not present

## 2022-08-09 DIAGNOSIS — G4733 Obstructive sleep apnea (adult) (pediatric): Secondary | ICD-10-CM | POA: Insufficient documentation

## 2022-08-09 DIAGNOSIS — K5730 Diverticulosis of large intestine without perforation or abscess without bleeding: Secondary | ICD-10-CM | POA: Insufficient documentation

## 2022-08-09 DIAGNOSIS — K219 Gastro-esophageal reflux disease without esophagitis: Secondary | ICD-10-CM | POA: Diagnosis not present

## 2022-08-09 DIAGNOSIS — Z1211 Encounter for screening for malignant neoplasm of colon: Secondary | ICD-10-CM | POA: Diagnosis present

## 2022-08-09 DIAGNOSIS — D123 Benign neoplasm of transverse colon: Secondary | ICD-10-CM | POA: Insufficient documentation

## 2022-08-09 HISTORY — PX: COLONOSCOPY WITH PROPOFOL: SHX5780

## 2022-08-09 SURGERY — COLONOSCOPY WITH PROPOFOL
Anesthesia: General

## 2022-08-09 MED ORDER — LIDOCAINE HCL (CARDIAC) PF 100 MG/5ML IV SOSY
PREFILLED_SYRINGE | INTRAVENOUS | Status: DC | PRN
  Administered 2022-08-09: 100 mg via INTRAVENOUS

## 2022-08-09 MED ORDER — PROPOFOL 1000 MG/100ML IV EMUL
INTRAVENOUS | Status: AC
  Filled 2022-08-09: qty 100

## 2022-08-09 MED ORDER — PROPOFOL 500 MG/50ML IV EMUL
INTRAVENOUS | Status: DC | PRN
  Administered 2022-08-09: 150 ug/kg/min via INTRAVENOUS

## 2022-08-09 MED ORDER — SODIUM CHLORIDE 0.9 % IV SOLN
INTRAVENOUS | Status: DC
  Administered 2022-08-09: 20 mL/h via INTRAVENOUS

## 2022-08-09 NOTE — Transfer of Care (Signed)
Immediate Anesthesia Transfer of Care Note  Patient: Elijah Logan  Procedure(s) Performed: COLONOSCOPY WITH PROPOFOL  Patient Location: PACU  Anesthesia Type:General  Level of Consciousness: awake and sedated  Airway & Oxygen Therapy: Patient Spontanous Breathing and Patient connected to face mask oxygen  Post-op Assessment: Report given to RN and Post -op Vital signs reviewed and stable  Post vital signs: Reviewed and stable  Last Vitals:  Vitals Value Taken Time  BP    Temp    Pulse    Resp    SpO2      Last Pain:  Vitals:   08/09/22 1015  TempSrc: Temporal  PainSc: 0-No pain         Complications: There were no known notable events for this encounter.

## 2022-08-09 NOTE — Anesthesia Postprocedure Evaluation (Signed)
Anesthesia Post Note  Patient: Elijah Logan  Procedure(s) Performed: COLONOSCOPY WITH PROPOFOL  Patient location during evaluation: Endoscopy Anesthesia Type: General Level of consciousness: awake and alert Pain management: pain level controlled Vital Signs Assessment: post-procedure vital signs reviewed and stable Respiratory status: spontaneous breathing, nonlabored ventilation, respiratory function stable and patient connected to nasal cannula oxygen Cardiovascular status: blood pressure returned to baseline and stable Postop Assessment: no apparent nausea or vomiting Anesthetic complications: no   There were no known notable events for this encounter.   Last Vitals:  Vitals:   08/09/22 1135 08/09/22 1145  BP: 111/68 116/78  Pulse: 80 69  Resp: 12 (!) 22  Temp: (!) 36.1 C   SpO2: 97% 96%    Last Pain:  Vitals:   08/09/22 1145  TempSrc:   PainSc: 0-No pain                 Louie Boston

## 2022-08-09 NOTE — H&P (Signed)
Outpatient short stay form Pre-procedure 08/09/2022  Regis Bill, MD  Primary Physician: Karie Schwalbe, MD  Reason for visit:  Surveillance  History of present illness:    42 y/o gentleman with history of obesity and recently diagnosed umbilical hernia here for surveillance colonoscopy. Last colonoscopy about 1 year ago with fair prep but multiple large polyps that were Ta's. No blood thinners. No family history of GI malignancies. No significant abdominal surgeries.    Current Facility-Administered Medications:    0.9 %  sodium chloride infusion, , Intravenous, Continuous, Feiga Nadel, Rossie Muskrat, MD, Last Rate: 20 mL/hr at 08/09/22 1030, Continued from Pre-op at 08/09/22 1030  Medications Prior to Admission  Medication Sig Dispense Refill Last Dose   cyclobenzaprine (FLEXERIL) 10 MG tablet Take 0.5-1 tablets (5-10 mg total) by mouth 2 (two) times daily as needed for muscle spasms. 30 tablet 0 Past Week   famotidine (PEPCID) 20 MG tablet Take 20 mg by mouth daily as needed for heartburn or indigestion.   Past Week   fluticasone (FLONASE) 50 MCG/ACT nasal spray Place 2 sprays into both nostrils as needed.    Past Week   loratadine (CLARITIN) 10 MG tablet Take 10 mg by mouth daily.   08/08/2022   pantoprazole (PROTONIX) 40 MG tablet Take 40 mg by mouth 2 (two) times daily.   Past Week   hyoscyamine (LEVSIN SL) 0.125 MG SL tablet Place 1 tablet (0.125 mg total) every 4 (four) hours as needed under the tongue. (Patient not taking: Reported on 06/01/2021) 60 tablet 3    ibuprofen (MOTRIN IB) 200 MG tablet Take 3 tablets (600 mg total) by mouth every 6 (six) hours as needed. 20 tablet 0    sucralfate (CARAFATE) 1 g tablet Take 1 g by mouth 4 (four) times daily -  with meals and at bedtime. (Patient not taking: Reported on 11/16/2021)        No Known Allergies   Past Medical History:  Diagnosis Date   Allergic rhinitis due to pollen    mild   Bulging lumbar disc    Chronic low back  pain    Has spina bifida occulta   Heart palpitations    PVCs noted on Event Monitor - some in [place] or couplets.   History of kidney stones    Hyperlipidemia    Moderate obesity    OSA on CPAP     Review of systems:  Otherwise negative.    Physical Exam  Gen: Alert, oriented. Appears stated age.  HEENT: PERRLA. Lungs: No respiratory distress CV: RRR Abd: soft, benign, no masses Ext: No edema    Planned procedures: Proceed with colonoscopy. The patient understands the nature of the planned procedure, indications, risks, alternatives and potential complications including but not limited to bleeding, infection, perforation, damage to internal organs and possible oversedation/side effects from anesthesia. The patient agrees and gives consent to proceed.  Please refer to procedure notes for findings, recommendations and patient disposition/instructions.     Regis Bill, MD [facility] Gastroenterology

## 2022-08-09 NOTE — Op Note (Signed)
[facility] Gastroenterology Patient Name: Elijah Logan Procedure Date: 08/09/2022 10:59 AM MRN: [id number] Account #: [id number] Date of Birth: Jan 12, 1981 Admit Type: Outpatient Age: 42 Room: [facility] ENDO ROOM 3 Gender: Male Note Status: Finalized Instrument Name: Nelda Marseille [id number] Procedure:             Colonoscopy Indications:           Surveillance: History of adenomatous polyps,                         inadequate prep on last exam (<35yr) Providers:             Eather Colas MD, MD Referring MD:          Karie Schwalbe (Referring MD) Medicines:             Monitored Anesthesia Care Complications:         No immediate complications. Estimated blood loss:                         Minimal. Procedure:             Pre-Anesthesia Assessment:                        - Prior to the procedure, a History and Physical was                         performed, and patient medications and allergies were                         reviewed. The patient is competent. The risks and                         benefits of the procedure and the sedation options and                         risks were discussed with the patient. All questions                         were answered and informed consent was obtained.                         Patient identification and proposed procedure were                         verified by the physician, the nurse, the                         anesthesiologist, the anesthetist and the technician                         in the endoscopy suite. Mental Status Examination:                         alert and oriented. Airway Examination: normal                         oropharyngeal airway and neck mobility. Respiratory  Examination: clear to auscultation. CV Examination:                         normal. Prophylactic Antibiotics: The patient does not                         require prophylactic antibiotics. Prior                          Anticoagulants: The patient has taken no anticoagulant                         or antiplatelet agents. ASA Grade Assessment: III - A                         patient with severe systemic disease. After reviewing                         the risks and benefits, the patient was deemed in                         satisfactory condition to undergo the procedure. The                         anesthesia plan was to use monitored anesthesia care                         (MAC). Immediately prior to administration of                         medications, the patient was re-assessed for adequacy                         to receive sedatives. The heart rate, respiratory                         rate, oxygen saturations, blood pressure, adequacy of                         pulmonary ventilation, and response to care were                         monitored throughout the procedure. The physical                         status of the patient was re-assessed after the                         procedure.                        After obtaining informed consent, the colonoscope was                         passed under direct vision. Throughout the procedure,                         the patient's blood pressure, pulse, and oxygen  saturations were monitored continuously. The                         Colonoscope was introduced through the anus and                         advanced to the the terminal ileum. The colonoscopy                         was performed without difficulty. The patient                         tolerated the procedure well. The quality of the bowel                         preparation was good. The terminal ileum, ileocecal                         valve, appendiceal orifice, and rectum were                         photographed. Findings:      The perianal and digital rectal examinations were normal.      The terminal ileum appeared normal.      Multiple small-mouthed diverticula  were found in the sigmoid colon,       descending colon and transverse colon.      A 3 mm polyp was found in the transverse colon. The polyp was sessile.       The polyp was removed with a cold snare. Resection and retrieval were       complete. Estimated blood loss was minimal.      A less than 1 mm polyp was found in the rectum. The polyp was sessile.       The polyp was removed with a jumbo cold forceps. Resection and retrieval       were complete. Estimated blood loss was minimal.      Internal hemorrhoids were found during retroflexion. The hemorrhoids       were Grade I (internal hemorrhoids that do not prolapse).      The exam was otherwise without abnormality on direct and retroflexion       views. Impression:            - The examined portion of the ileum was normal.                        - Diverticulosis in the sigmoid colon, in the                         descending colon and in the transverse colon.                        - One 3 mm polyp in the transverse colon, removed with                         a cold snare. Resected and retrieved.                        - One less than 1 mm polyp in the rectum, removed with  a jumbo cold forceps. Resected and retrieved.                        - Internal hemorrhoids.                        - The examination was otherwise normal on direct and                         retroflexion views. Recommendation:        - Discharge patient to home.                        - Resume previous diet.                        - Continue present medications.                        - Await pathology results.                        - Repeat colonoscopy in 3 years for surveillance.                        - Return to referring physician as previously                         scheduled. Procedure Code(s):     --- Professional ---                        [phone], Colonoscopy, flexible; with removal of                         tumor(s), polyp(s),  or other lesion(s) by snare                         technique                        [id number], 59, Colonoscopy, flexible; with biopsy, single                         or multiple Diagnosis Code(s):     --- Professional ---                        Z86.010, Personal history of colonic polyps                        K64.0, First degree hemorrhoids                        D12.3, Benign neoplasm of transverse colon (hepatic                         flexure or splenic flexure)                        D12.8, Benign neoplasm of rectum                        K57.30, Diverticulosis of large  intestine without                         perforation or abscess without bleeding CPT copyright 2022 [employer]. All rights reserved. The codes documented in this report are preliminary and upon coder review may  be revised to meet current compliance requirements. Eather Colas MD, MD 08/09/2022 11:33:49 AM Number of Addenda: 0 Note Initiated On: 08/09/2022 10:59 AM Scope Withdrawal Time: 0 hours 10 minutes 48 seconds  Total Procedure Duration: 0 hours 13 minutes 17 seconds  Estimated Blood Loss:  Estimated blood loss was minimal.      [facility]

## 2022-08-09 NOTE — Anesthesia Preprocedure Evaluation (Signed)
Anesthesia Evaluation  Patient identified by MRN, date of birth, ID band Patient awake    Reviewed: Allergy & Precautions, NPO status , Patient's Chart, lab work & pertinent test results  History of Anesthesia Complications Negative for: history of anesthetic complications  Airway Mallampati: III  TM Distance: >3 FB Neck ROM: full    Dental no notable dental hx.    Pulmonary sleep apnea and Continuous Positive Airway Pressure Ventilation    Pulmonary exam normal        Cardiovascular Normal cardiovascular exam+ dysrhythmias (PVCs)      Neuro/Psych  Neuromuscular disease  negative psych ROS   GI/Hepatic ,GERD  Medicated,,  Endo/Other    Morbid obesity  Renal/GU negative Renal ROS  negative genitourinary   Musculoskeletal   Abdominal   Peds  Hematology negative hematology ROS (+)   Anesthesia Other Findings Past Medical History: No date: Allergic rhinitis due to pollen     Comment:  mild No date: Bulging lumbar disc No date: Chronic low back pain     Comment:  Has spina bifida occulta No date: Heart palpitations     Comment:  PVCs noted on Event Monitor - some in [place] or               couplets. No date: History of kidney stones No date: Hyperlipidemia No date: Moderate obesity No date: OSA on CPAP  Past Surgical History: 06/02/2021: COLONOSCOPY WITH PROPOFOL; N/A     Comment:  Procedure: COLONOSCOPY WITH PROPOFOL;  Surgeon:               Regis Bill, MD;  Location: [facility] ENDOSCOPY;                Service: Endoscopy;  Laterality: N/A; 06/02/2021: ESOPHAGOGASTRODUODENOSCOPY (EGD) WITH PROPOFOL; N/A     Comment:  Procedure: ESOPHAGOGASTRODUODENOSCOPY (EGD) WITH               PROPOFOL;  Surgeon: Regis Bill, MD;  Location:               [facility] ENDOSCOPY;  Service: Endoscopy;  Laterality: N/A; 1998: OPEN ANTERIOR SHOULDER RECONSTRUCTION     Comment:  Right 12/28/2013: TRANSTHORACIC  ECHOCARDIOGRAM     Comment:  EF 55-60%.  Normal diastolic function 01/02/2014: TREADMILL STRESS ECHO     Comment:  Ex 10:31 Min; 12.5 METS --> no EKG changes, Normal pre &              post Echo Wall Motion. -- No ischemia or infarction  BMI    Body Mass Index: 48.18 kg/m      Reproductive/Obstetrics negative OB ROS                             Anesthesia Physical Anesthesia Plan  ASA: 2  Anesthesia Plan: General   Post-op Pain Management: Minimal or no pain anticipated   Induction: Intravenous  PONV Risk Score and Plan: 1 and Propofol infusion and TIVA  Airway Management Planned: Natural Airway and Nasal Cannula  Additional Equipment:   Intra-op Plan:   Post-operative Plan:   Informed Consent: I have reviewed the patients History and Physical, chart, labs and discussed the procedure including the risks, benefits and alternatives for the proposed anesthesia with the patient or authorized representative who has indicated his/her understanding and acceptance.     Dental Advisory Given  Plan Discussed with: Anesthesiologist, CRNA and Surgeon  Anesthesia Plan Comments: (Patient  consented for risks of anesthesia including but not limited to:  - adverse reactions to medications - risk of airway placement if required - damage to eyes, teeth, lips or other oral mucosa - nerve damage due to positioning  - sore throat or hoarseness - Damage to heart, brain, nerves, lungs, other parts of body or loss of life  Patient voiced understanding.)        Anesthesia Quick Evaluation

## 2022-08-09 NOTE — Interval H&P Note (Signed)
History and Physical Interval Note:  08/09/2022 11:09 AM  Elijah Logan  has presented today for surgery, with the diagnosis of personal history colon polyps.  The various methods of treatment have been discussed with the patient and family. After consideration of risks, benefits and other options for treatment, the patient has consented to  Procedure(s) with comments: COLONOSCOPY WITH PROPOFOL (N/A) - PREFERS AM as a surgical intervention.  The patient's history has been reviewed, patient examined, no change in status, stable for surgery.  I have reviewed the patient's chart and labs.  Questions were answered to the patient's satisfaction.     Regis Bill  Ok to proceed with colonoscopy

## 2022-08-10 ENCOUNTER — Encounter: Payer: Self-pay | Admitting: Gastroenterology

## 2022-08-10 LAB — SURGICAL PATHOLOGY

## 2022-08-11 ENCOUNTER — Encounter
Admission: RE | Admit: 2022-08-11 | Discharge: 2022-08-11 | Disposition: A | Discharge status: Home / Self Care | Payer: No Typology Code available for payment source | Source: Ambulatory Visit | Attending: Surgery | Admitting: Surgery

## 2022-08-11 HISTORY — DX: Migraine, unspecified, not intractable, without status migrainosus: G43.909

## 2022-08-11 HISTORY — DX: Ventricular premature depolarization: I49.3

## 2022-08-11 HISTORY — DX: Gastro-esophageal reflux disease without esophagitis: K21.9

## 2022-08-11 NOTE — Patient Instructions (Addendum)
Your procedure is scheduled on:08-19-22 Thursday Report to the Registration Desk on the 1st floor of the [facility].Then proceed to the 2nd floor Surgery Desk To find out your arrival time, please call [phone] between 1PM - 3PM on:08-18-22 Wednesday If your arrival time is 6:00 am, do not arrive before that time as the [facility] entrance doors do not open until 6:00 am.  REMEMBER: Instructions that are not followed completely may result in serious medical risk, up to and including death; or upon the discretion of your surgeon and anesthesiologist your surgery may need to be rescheduled.  Do not eat food after midnight the night before surgery.  No gum chewing or hard candies.  You may however, drink CLEAR liquids up to 2 hours before you are scheduled to arrive for your surgery. Do not drink anything within 2 hours of your scheduled arrival time.  Clear liquids include: - water  - apple juice without pulp - gatorade (not RED colors) - black coffee or tea (Do NOT add milk or creamers to the coffee or tea) Do NOT drink anything that is not on this list.  One week prior to surgery:Last dose on 08-11-22 Stop Anti-inflammatories (NSAIDS) such as Advil, Aleve, Ibuprofen, Motrin, Naproxen, Naprosyn and Aspirin based products such as Excedrin, Goody's Powder, BC Powder.You may however, take Tylenol if needed for pain up until the day of surgery.  Stop ANY OVER THE COUNTER supplements/vitamins NOW (08-11-22) until after surgery (Multivitamin, Turmeric and Probiotic)  TAKE ONLY THESE MEDICATIONS THE MORNING OF SURGERY WITH A SIP OF WATER: -loratadine (CLARITIN)  -famotidine (PEPCID)-take one the night before and one on the morning of surgery - helps to prevent nausea after surgery.)  No Alcohol for 24 hours before or after surgery.  No Smoking including e-cigarettes for 24 hours before surgery.  No chewable tobacco products for at least 6 hours before surgery.  No nicotine patches on  the day of surgery.  Do not use any "recreational" drugs for at least a week (preferably 2 weeks) before your surgery.  Please be advised that the combination of cocaine and anesthesia may have negative outcomes, up to and including death. If you test positive for cocaine, your surgery will be cancelled.  On the morning of surgery brush your teeth with toothpaste and water, you may rinse your mouth with mouthwash if you wish. Do not swallow any toothpaste or mouthwash.  Use CHG Soap as directed on instruction sheet.  Bring your C-Pap machine to the hospital  Do not wear jewelry, make-up, hairpins, clips or nail polish.  Do not wear lotions, powders, or perfumes.   Do not shave body hair from the neck down 48 hours before surgery.  Contact lenses, hearing aids and dentures may not be worn into surgery.  Do not bring valuables to the hospital. [facility] is not responsible for any missing/lost belongings or valuables.  Notify your doctor if there is any change in your medical condition (cold, fever, infection).  Wear comfortable clothing (specific to your surgery type) to the hospital.  After surgery, you can help prevent lung complications by doing breathing exercises.  Take deep breaths and cough every 1-2 hours. Your doctor may order a device called an Incentive Spirometer to help you take deep breaths. When coughing or sneezing, hold a pillow firmly against your incision with both hands. This is called "splinting." Doing this helps protect your incision. It also decreases belly discomfort.  If you are being admitted to the  hospital overnight, leave your suitcase in the car. After surgery it may be brought to your room.  In case of increased patient census, it may be necessary for you, the patient, to continue your postoperative care in the Same Day Surgery department.  If you are being discharged the day of surgery, you will not be allowed to drive home. You will need a  responsible individual to drive you home and stay with you for 24 hours after surgery.   If you are taking public transportation, you will need to have a responsible individual with you.  Please call the Pre-admissions Testing Dept. at [phone] if you have any questions about these instructions.  Surgery Visitation Policy:  Patients having surgery or a procedure may have two visitors.  Children under the age of 33 must have an adult with them who is not the patient.     Preparing for Surgery with CHLORHEXIDINE GLUCONATE (CHG) Soap  Chlorhexidine Gluconate (CHG) Soap  o An antiseptic cleaner that kills germs and bonds with the skin to continue killing germs even after washing  o Used for showering the night before surgery and morning of surgery  Before surgery, you can play an important role by reducing the number of germs on your skin.  CHG (Chlorhexidine gluconate) soap is an antiseptic cleanser which kills germs and bonds with the skin to continue killing germs even after washing.  Please do not use if you have an allergy to CHG or antibacterial soaps. If your skin becomes reddened/irritated stop using the CHG.  1. Shower the NIGHT BEFORE SURGERY and the MORNING OF SURGERY with CHG soap.  2. If you choose to wash your hair, wash your hair first as usual with your normal shampoo.  3. After shampooing, rinse your hair and body thoroughly to remove the shampoo.  4. Use CHG as you would any other liquid soap. You can apply CHG directly to the skin and wash gently with a scrungie or a clean washcloth.  5. Apply the CHG soap to your body only from the neck down. Do not use on open wounds or open sores. Avoid contact with your eyes, ears, mouth, and genitals (private parts). Wash face and genitals (private parts) with your normal soap.  6. Wash thoroughly, paying special attention to the area where your surgery will be performed.  7. Thoroughly rinse your body with warm  water.  8. Do not shower/wash with your normal soap after using and rinsing off the CHG soap.  9. Pat yourself dry with a clean towel.  10. Wear clean pajamas to bed the night before surgery.  12. Place clean sheets on your bed the night of your first shower and do not sleep with pets.  13. Shower again with the CHG soap on the day of surgery prior to arriving at the hospital.  14. Do not apply any deodorants/lotions/powders.  15. Please wear clean clothes to the hospital.

## 2022-08-19 ENCOUNTER — Encounter
Admission: RE | Disposition: A | Discharge status: Home / Self Care | Payer: Self-pay | Source: Home / Self Care | Attending: Surgery

## 2022-08-19 ENCOUNTER — Encounter: Payer: Self-pay | Admitting: Surgery

## 2022-08-19 ENCOUNTER — Ambulatory Visit: Payer: No Typology Code available for payment source | Admitting: Anesthesiology

## 2022-08-19 ENCOUNTER — Ambulatory Visit
Admission: RE | Admit: 2022-08-19 | Discharge: 2022-08-19 | Disposition: A | Discharge status: Home / Self Care | Payer: No Typology Code available for payment source | Attending: Surgery | Admitting: Surgery

## 2022-08-19 ENCOUNTER — Other Ambulatory Visit: Payer: Self-pay

## 2022-08-19 DIAGNOSIS — K420 Umbilical hernia with obstruction, without gangrene: Secondary | ICD-10-CM | POA: Insufficient documentation

## 2022-08-19 DIAGNOSIS — E785 Hyperlipidemia, unspecified: Secondary | ICD-10-CM | POA: Insufficient documentation

## 2022-08-19 DIAGNOSIS — Q760 Spina bifida occulta: Secondary | ICD-10-CM | POA: Diagnosis not present

## 2022-08-19 DIAGNOSIS — G4733 Obstructive sleep apnea (adult) (pediatric): Secondary | ICD-10-CM | POA: Diagnosis not present

## 2022-08-19 DIAGNOSIS — K219 Gastro-esophageal reflux disease without esophagitis: Secondary | ICD-10-CM | POA: Insufficient documentation

## 2022-08-19 DIAGNOSIS — Z6841 Body Mass Index (BMI) 40.0 and over, adult: Secondary | ICD-10-CM | POA: Diagnosis not present

## 2022-08-19 HISTORY — PX: UMBILICAL HERNIA REPAIR: SHX196

## 2022-08-19 SURGERY — REPAIR, HERNIA, UMBILICAL, ADULT
Anesthesia: General | Site: Abdomen

## 2022-08-19 MED ORDER — FENTANYL CITRATE (PF) 100 MCG/2ML IJ SOLN
INTRAMUSCULAR | Status: AC
  Filled 2022-08-19: qty 2

## 2022-08-19 MED ORDER — CHLORHEXIDINE GLUCONATE 0.12 % MT SOLN
15.0000 mL | Freq: Once | OROMUCOSAL | Status: AC
  Administered 2022-08-19: 15 mL via OROMUCOSAL

## 2022-08-19 MED ORDER — MIDAZOLAM HCL 2 MG/2ML IJ SOLN
INTRAMUSCULAR | Status: AC
  Filled 2022-08-19: qty 2

## 2022-08-19 MED ORDER — ROCURONIUM BROMIDE 100 MG/10ML IV SOLN
INTRAVENOUS | Status: DC | PRN
  Administered 2022-08-19: 20 mg via INTRAVENOUS
  Administered 2022-08-19: 60 mg via INTRAVENOUS

## 2022-08-19 MED ORDER — CEFAZOLIN SODIUM-DEXTROSE 2-4 GM/100ML-% IV SOLN
INTRAVENOUS | Status: AC
  Filled 2022-08-19: qty 100

## 2022-08-19 MED ORDER — DEXMEDETOMIDINE HCL IN NACL 80 MCG/20ML IV SOLN
INTRAVENOUS | Status: DC | PRN
  Administered 2022-08-19: 12 ug via BUCCAL

## 2022-08-19 MED ORDER — ACETAMINOPHEN 500 MG PO TABS
1000.0000 mg | ORAL_TABLET | ORAL | Status: AC
  Administered 2022-08-19: 1000 mg via ORAL

## 2022-08-19 MED ORDER — CEFAZOLIN SODIUM 1 G IJ SOLR
INTRAMUSCULAR | Status: AC
  Filled 2022-08-19: qty 10

## 2022-08-19 MED ORDER — BUPIVACAINE-EPINEPHRINE (PF) 0.5% -1:200000 IJ SOLN
INTRAMUSCULAR | Status: DC | PRN
  Administered 2022-08-19: 10 mL

## 2022-08-19 MED ORDER — OXYCODONE HCL 5 MG/5ML PO SOLN: 5.0000 mg | Freq: Once | ORAL | Status: AC | PRN

## 2022-08-19 MED ORDER — BUPIVACAINE HCL (PF) 0.5 % IJ SOLN
INTRAMUSCULAR | Status: AC
  Filled 2022-08-19: qty 30

## 2022-08-19 MED ORDER — ACETAMINOPHEN 500 MG PO TABS
ORAL_TABLET | ORAL | Status: AC
  Filled 2022-08-19: qty 2

## 2022-08-19 MED ORDER — FENTANYL CITRATE (PF) 100 MCG/2ML IJ SOLN
INTRAMUSCULAR | Status: DC | PRN
  Administered 2022-08-19: 50 ug via INTRAVENOUS

## 2022-08-19 MED ORDER — CELECOXIB 200 MG PO CAPS
ORAL_CAPSULE | ORAL | Status: AC
  Filled 2022-08-19: qty 1

## 2022-08-19 MED ORDER — LIDOCAINE HCL (CARDIAC) PF 100 MG/5ML IV SOSY
PREFILLED_SYRINGE | INTRAVENOUS | Status: DC | PRN
  Administered 2022-08-19: 100 mg via INTRAVENOUS

## 2022-08-19 MED ORDER — FENTANYL CITRATE (PF) 100 MCG/2ML IJ SOLN
25.0000 ug | INTRAMUSCULAR | Status: DC | PRN
  Administered 2022-08-19 (×2): 25 ug via INTRAVENOUS

## 2022-08-19 MED ORDER — MIDAZOLAM HCL 2 MG/2ML IJ SOLN
INTRAMUSCULAR | Status: DC | PRN
  Administered 2022-08-19: 2 mg via INTRAVENOUS

## 2022-08-19 MED ORDER — BUPIVACAINE LIPOSOME 1.3 % IJ SUSP
INTRAMUSCULAR | Status: DC | PRN
  Administered 2022-08-19: 40 mL

## 2022-08-19 MED ORDER — HYDROCODONE-ACETAMINOPHEN 5-325 MG PO TABS: 1.0000 | ORAL_TABLET | Freq: Four times a day (QID) | ORAL | 0 refills | Status: DC | PRN

## 2022-08-19 MED ORDER — DEXAMETHASONE SODIUM PHOSPHATE 10 MG/ML IJ SOLN
INTRAMUSCULAR | Status: DC | PRN
  Administered 2022-08-19: 10 mg via INTRAVENOUS

## 2022-08-19 MED ORDER — OXYCODONE HCL 5 MG PO TABS
5.0000 mg | ORAL_TABLET | Freq: Once | ORAL | Status: AC | PRN
  Administered 2022-08-19: 5 mg via ORAL

## 2022-08-19 MED ORDER — OXYCODONE HCL 5 MG PO TABS
ORAL_TABLET | ORAL | Status: AC
  Filled 2022-08-19: qty 1

## 2022-08-19 MED ORDER — ACETAMINOPHEN 10 MG/ML IV SOLN: 1000.0000 mg | Freq: Once | INTRAVENOUS | Status: DC | PRN

## 2022-08-19 MED ORDER — SODIUM CHLORIDE (PF) 0.9 % IJ SOLN
INTRAMUSCULAR | Status: AC
  Filled 2022-08-19: qty 10

## 2022-08-19 MED ORDER — IBUPROFEN 600 MG PO TABS: 600.0000 mg | ORAL_TABLET | Freq: Three times a day (TID) | ORAL | 0 refills | Status: AC | PRN

## 2022-08-19 MED ORDER — GABAPENTIN 300 MG PO CAPS
300.0000 mg | ORAL_CAPSULE | ORAL | Status: AC
  Administered 2022-08-19: 300 mg via ORAL

## 2022-08-19 MED ORDER — BUPIVACAINE LIPOSOME 1.3 % IJ SUSP
INTRAMUSCULAR | Status: AC
  Filled 2022-08-19: qty 10

## 2022-08-19 MED ORDER — PROPOFOL 10 MG/ML IV BOLUS
INTRAVENOUS | Status: DC | PRN
  Administered 2022-08-19: 200 mg via INTRAVENOUS

## 2022-08-19 MED ORDER — LACTATED RINGERS IV SOLN: INTRAVENOUS | Status: DC

## 2022-08-19 MED ORDER — CEFAZOLIN SODIUM-DEXTROSE 1-4 GM/50ML-% IV SOLN
INTRAVENOUS | Status: DC | PRN
  Administered 2022-08-19: 1 g via INTRAVENOUS

## 2022-08-19 MED ORDER — PROPOFOL 10 MG/ML IV BOLUS
INTRAVENOUS | Status: AC
  Filled 2022-08-19: qty 20

## 2022-08-19 MED ORDER — ACETAMINOPHEN 325 MG PO TABS: 650.0000 mg | ORAL_TABLET | Freq: Three times a day (TID) | ORAL | 0 refills | Status: AC | PRN

## 2022-08-19 MED ORDER — ONDANSETRON HCL 4 MG/2ML IJ SOLN
INTRAMUSCULAR | Status: DC | PRN
  Administered 2022-08-19: 4 mg via INTRAVENOUS

## 2022-08-19 MED ORDER — ONDANSETRON HCL 4 MG/2ML IJ SOLN: 4.0000 mg | Freq: Once | INTRAMUSCULAR | Status: DC | PRN

## 2022-08-19 MED ORDER — CHLORHEXIDINE GLUCONATE 0.12 % MT SOLN
OROMUCOSAL | Status: AC
  Filled 2022-08-19: qty 15

## 2022-08-19 MED ORDER — 0.9 % SODIUM CHLORIDE (POUR BTL) OPTIME
TOPICAL | Status: DC | PRN
  Administered 2022-08-19: 500 mL

## 2022-08-19 MED ORDER — ORAL CARE MOUTH RINSE: 15.0000 mL | Freq: Once | OROMUCOSAL | Status: AC

## 2022-08-19 MED ORDER — GABAPENTIN 300 MG PO CAPS
ORAL_CAPSULE | ORAL | Status: AC
  Filled 2022-08-19: qty 1

## 2022-08-19 MED ORDER — CEFAZOLIN SODIUM-DEXTROSE 2-4 GM/100ML-% IV SOLN
2.0000 g | INTRAVENOUS | Status: AC
  Administered 2022-08-19: 2 g via INTRAVENOUS

## 2022-08-19 MED ORDER — CHLORHEXIDINE GLUCONATE CLOTH 2 % EX PADS
6.0000 | MEDICATED_PAD | Freq: Once | CUTANEOUS | Status: AC
  Administered 2022-08-19: 6 via TOPICAL

## 2022-08-19 MED ORDER — BUPIVACAINE LIPOSOME 1.3 % IJ SUSP
INTRAMUSCULAR | Status: AC
  Filled 2022-08-19: qty 20

## 2022-08-19 MED ORDER — CELECOXIB 200 MG PO CAPS
200.0000 mg | ORAL_CAPSULE | ORAL | Status: AC
  Administered 2022-08-19: 200 mg via ORAL

## 2022-08-19 MED ORDER — SUGAMMADEX SODIUM 500 MG/5ML IV SOLN
INTRAVENOUS | Status: DC | PRN
  Administered 2022-08-19: 400 mg via INTRAVENOUS

## 2022-08-19 SURGICAL SUPPLY — 34 items
ADH SKN CLS APL DERMABOND .7 (GAUZE/BANDAGES/DRESSINGS) ×1
APL PRP STRL LF DISP 70% ISPRP (MISCELLANEOUS) ×1
BLADE SURG 15 STRL LF DISP TIS (BLADE) ×1 IMPLANT
BLADE SURG 15 STRL SS (BLADE) ×1
CHLORAPREP W/TINT 26 (MISCELLANEOUS) ×1 IMPLANT
DERMABOND ADVANCED .7 DNX12 (GAUZE/BANDAGES/DRESSINGS) ×1 IMPLANT
DRAPE LAPAROTOMY 77X122 PED (DRAPES) ×1 IMPLANT
ELECT REM PT RETURN 9FT ADLT (ELECTROSURGICAL) ×1
ELECTRODE REM PT RTRN 9FT ADLT (ELECTROSURGICAL) ×1 IMPLANT
GLOVE BIOGEL PI IND STRL 7.0 (GLOVE) ×1 IMPLANT
GLOVE SURG SYN 6.5 ES PF (GLOVE) ×1 IMPLANT
GLOVE SURG SYN 6.5 PF PI (GLOVE) ×1 IMPLANT
GOWN STRL REUS W/ TWL LRG LVL3 (GOWN DISPOSABLE) ×3 IMPLANT
GOWN STRL REUS W/TWL LRG LVL3 (GOWN DISPOSABLE) ×3
KIT TURNOVER KIT A (KITS) ×1 IMPLANT
MANIFOLD NEPTUNE II (INSTRUMENTS) ×1 IMPLANT
MESH VENTRALEX ST 2.5 CRC MED (Mesh General) IMPLANT
NDL HYPO 22X1.5 SAFETY MO (MISCELLANEOUS) ×1 IMPLANT
NEEDLE HYPO 22X1.5 SAFETY MO (MISCELLANEOUS) ×1 IMPLANT
NS IRRIG 500ML POUR BTL (IV SOLUTION) ×1 IMPLANT
PACK BASIN MINOR ARMC (MISCELLANEOUS) ×1 IMPLANT
SUT ETHIBOND NAB MO 7 #0 18IN (SUTURE) ×1 IMPLANT
SUT MNCRL 4-0 (SUTURE) ×1
SUT MNCRL 4-0 27XMFL (SUTURE) ×1
SUT VIC AB 2-0 SH 27 (SUTURE) ×1
SUT VIC AB 2-0 SH 27XBRD (SUTURE) ×1 IMPLANT
SUT VIC AB 3-0 SH 27 (SUTURE) ×1
SUT VIC AB 3-0 SH 27X BRD (SUTURE) ×1 IMPLANT
SUTURE MNCRL 4-0 27XMF (SUTURE) ×1 IMPLANT
SYR 10ML LL (SYRINGE) ×2 IMPLANT
TOWEL OR 17X26 4PK STRL BLUE (TOWEL DISPOSABLE) ×1 IMPLANT
TRAP FLUID SMOKE EVACUATOR (MISCELLANEOUS) ×1 IMPLANT
WATER STERILE IRR 1000ML POUR (IV SOLUTION) ×1 IMPLANT
WATER STERILE IRR 500ML POUR (IV SOLUTION) ×1 IMPLANT

## 2022-08-19 NOTE — Anesthesia Procedure Notes (Signed)
Procedure Name: Intubation Date/Time: 08/19/2022 11:39 AM  Performed by: Ginger Carne, CRNAPre-anesthesia Checklist: Patient identified, Emergency Drugs available, Suction available, Patient being monitored and Timeout performed Patient Re-evaluated:Patient Re-evaluated prior to induction Oxygen Delivery Method: Circle system utilized Preoxygenation: Pre-oxygenation with 100% oxygen Induction Type: IV induction Ventilation: Mask ventilation without difficulty Laryngoscope Size: McGraph and 4 Grade View: Grade II Tube type: Oral Tube size: 7.5 mm Number of attempts: 1 Airway Equipment and Method: Stylet and Video-laryngoscopy Placement Confirmation: ETT inserted through vocal cords under direct vision, positive ETCO2 and breath sounds checked- equal and bilateral Secured at: 19 cm Tube secured with: Tape Dental Injury: Teeth and Oropharynx as per pre-operative assessment  Difficulty Due To: Difficult Airway- due to limited oral opening and Difficult Airway- due to large tongue

## 2022-08-19 NOTE — Discharge Instructions (Addendum)
Hernia repair, Care After This sheet gives you information about how to care for yourself after your procedure. Your health care provider may also give you more specific instructions. If you have problems or questions, contact your health care provider. What can I expect after the procedure? After your procedure, it is common to have the following: Pain in your abdomen, especially in the incision areas. You will be given medicine to control the pain. Tiredness. This is a normal part of the recovery process. Your energy level will return to normal over the next several weeks. Changes in your bowel movements, such as constipation or needing to go more often. Talk with your health care provider about how to manage this. Follow these instructions at home: Medicines  tylenol and advil as needed for discomfort.  Please alternate between the two every four hours as needed for pain.    Use narcotics, if prescribed, only when tylenol and motrin is not enough to control pain.  325-650mg every 8hrs to max of 3000mg/24hrs (including the 325mg in every norco dose) for the tylenol.    Advil up to 800mg per dose every 8hrs as needed for pain.   PLEASE RECORD NUMBER OF PILLS TAKEN UNTIL NEXT FOLLOW UP APPT.  THIS WILL HELP DETERMINE HOW READY YOU ARE TO BE RELEASED FROM ANY ACTIVITY RESTRICTIONS Do not drive or use heavy machinery while taking prescription pain medicine. Do not drink alcohol while taking prescription pain medicine.  Incision care    Follow instructions from your health care provider about how to take care of your incision areas. Make sure you: Keep your incisions clean and dry. Wash your hands with soap and water before and after applying medicine to the areas, and before and after changing your bandage (dressing). If soap and water are not available, use hand sanitizer. Change your dressing as told by your health care provider. Leave stitches (sutures), skin glue, or adhesive strips in  place. These skin closures may need to stay in place for 2 weeks or longer. If adhesive strip edges start to loosen and curl up, you may trim the loose edges. Do not remove adhesive strips completely unless your health care provider tells you to do that. Do not wear tight clothing over the incisions. Tight clothing may rub and irritate the incision areas, which may cause the incisions to open. Do not take baths, swim, or use a hot tub until your health care provider approves. OK TO SHOWER IN 24HRS.   Check your incision area every day for signs of infection. Check for: More redness, swelling, or pain. More fluid or blood. Warmth. Pus or a bad smell. Activity Avoid lifting anything that is heavier than 10 lb (4.5 kg) for 2 weeks or until your health care provider says it is okay. No pushing/pulling greater than 30lbs You may resume normal activities as told by your health care provider. Ask your health care provider what activities are safe for you. Take rest breaks during the day as needed. Eating and drinking Follow instructions from your health care provider about what you can eat after surgery. To prevent or treat constipation while you are taking prescription pain medicine, your health care provider may recommend that you: Drink enough fluid to keep your urine clear or pale yellow. Take over-the-counter or prescription medicines. Eat foods that are high in fiber, such as fresh fruits and vegetables, whole grains, and beans. Limit foods that are high in fat and processed sugars, such as fried and   sweet foods. General instructions Ask your health care provider when you will need an appointment to get your sutures or staples removed. Keep all follow-up visits as told by your health care provider. This is important. Contact a health care provider if: You have more redness, swelling, or pain around your incisions. You have more fluid or blood coming from the incisions. Your incisions feel  warm to the touch. You have pus or a bad smell coming from your incisions or your dressing. You have a fever. You have an incision that breaks open (edges not staying together) after sutures or staples have been removed. You develop a rash. You have chest pain or difficulty breathing. You have pain or swelling in your legs. You feel light-headed or you faint. Your abdomen swells (becomes distended). You have nausea or vomiting. You have blood in your stool (feces). This information is not intended to replace advice given to you by your health care provider. Make sure you discuss any questions you have with your health care provider. Document Released: 11/06/2004 Document Revised: 01/06/2018 Document Reviewed: 01/19/2016 Elsevier Interactive Patient Education  2019 [employer]   AMBULATORY SURGERY  DISCHARGE INSTRUCTIONS   The drugs that you were given will stay in your system until tomorrow so for the next 24 hours you should not:  Drive an automobile Make any legal decisions Drink any alcoholic beverage   You may resume regular meals tomorrow.  Today it is better to start with liquids and gradually work up to solid foods.  You may eat anything you prefer, but it is better to start with liquids, then soup and crackers, and gradually work up to solid foods.   Please notify your doctor immediately if you have any unusual bleeding, trouble breathing, redness and pain at the surgery site, drainage, fever, or pain not relieved by medication.    Additional Instructions:        Please contact your physician with any problems or Same Day Surgery at [phone], Monday through Friday 6 am to 4 pm, or [place] [facility] [place] number at [phone].  

## 2022-08-19 NOTE — Interval H&P Note (Signed)
No change. OK to proceed.

## 2022-08-19 NOTE — Op Note (Signed)
Preoperative diagnosis: umbilical hernia, incarcerated, initial Postoperative diagnosis: same  Procedure:  Open umbilical hernia repair with mesh  Anesthesia: LMA  Surgeon: Sung Amabile  Wound Classification: Clean  Specimen: none  Complications: None  Estimated Blood Loss: minimal  Indications:see HPI  Findings: 2cm x 1.8cm incarcerated umbilical hernia 4. Tension free repair achieved with mesh and suture 5. Adequate hemostasis  Description of procedure: The patient was brought to the operating room and general anesthesia was induced. A time-out was completed verifying correct patient, procedure, site, positioning, and implant(s) and/or special equipment prior to beginning this procedure. Antibiotics were administered prior to making the incision. SCDs placed. The anterior abdominal wall was prepped and draped in the standard sterile fashion.   An infraumbilical incision was made after infusing the preplanned incision with half percent Marcaine.  Dissection carried down to fascia where the umbilical stalk was noted.  The stalk was transected and there was noted to be a 2cm x 1.8cm umbilical hernia.  The preperitoneal fat contents were dissected off the surrounding structures and reduced.  Hemostasis was confirmed prior to reducing the actual contents. Due to size of defect, decision was made to proceed with a mesh repair.    A 6.8cm umbilical mesh was placed within the preperitoneal cavity through the present defect and secured in place on the abdominal wall circumfrentially using interrupted 0 Ethibond sutures.  Once the mesh was noted to be laying flat and secured to the abdominal wall the defect itself was primary closed using 0 Ethibond interrupted fashion.  The fascia as well as the skin incision was then infused with exparel. After confirming hemostasis, the umbilical stalk was reattached to the abdominal wall using 2-0 Vicryl and the wound was irrigated and closed in a multilayer  fashion, using 3-0 Vicryl for the deep dermal layer in an interrupted fashion and running 4-0 Monocryl in a subcuticular fashion.  Wound was then dressed with Dermabond.  Patient was then successfully awakened and transferred to PACU in stable condition.  At the end of the procedure sponge and instrument counts were correct

## 2022-08-19 NOTE — Transfer of Care (Signed)
Immediate Anesthesia Transfer of Care Note  Patient: Elijah Logan  Procedure(s) Performed: HERNIA REPAIR UMBILICAL ADULT (Abdomen)  Patient Location: PACU  Anesthesia Type:General  Level of Consciousness: drowsy  Airway & Oxygen Therapy: Patient Spontanous Breathing and Patient connected to nasal cannula oxygen  Post-op Assessment: Report given to RN and Post -op Vital signs reviewed and stable  Post vital signs: Reviewed and stable  Last Vitals:  Vitals Value Taken Time  BP 106/66 08/19/22 1330  Temp 36.1 C 08/19/22 1330  Pulse 65 08/19/22 1333  Resp 20 08/19/22 1333  SpO2 91 % 08/19/22 1333  Vitals shown include unvalidated device data.  Last Pain:  Vitals:   08/19/22 1330  TempSrc:   PainSc: Asleep         Complications: No notable events documented.

## 2022-08-19 NOTE — Anesthesia Postprocedure Evaluation (Signed)
Anesthesia Post Note  Patient: Elijah Logan  Procedure(s) Performed: HERNIA REPAIR UMBILICAL ADULT (Abdomen)  Patient location during evaluation: PACU Anesthesia Type: General Level of consciousness: awake and alert, oriented and patient cooperative Pain management: pain level controlled Vital Signs Assessment: post-procedure vital signs reviewed and stable Respiratory status: spontaneous breathing, nonlabored ventilation and respiratory function stable Cardiovascular status: blood pressure returned to baseline and stable Postop Assessment: adequate PO intake Anesthetic complications: no   No notable events documented.   Last Vitals:  Vitals:   08/19/22 1345 08/19/22 1400  BP: (!) 109/58 (!) 106/58  Pulse: 65 65  Resp: 20 19  Temp:    SpO2: 93% 94%    Last Pain:  Vitals:   08/19/22 1400  TempSrc:   PainSc: Asleep                 Reed Breech

## 2022-08-19 NOTE — Anesthesia Preprocedure Evaluation (Addendum)
Anesthesia Evaluation  Patient identified by MRN, date of birth, ID band Patient awake    Reviewed: Allergy & Precautions, NPO status , Patient's Chart, lab work & pertinent test results  History of Anesthesia Complications Negative for: history of anesthetic complications  Airway Mallampati: I   Neck ROM: Full    Dental no notable dental hx.    Pulmonary sleep apnea and Continuous Positive Airway Pressure Ventilation  Sinus drainage causing cough x1 month   Pulmonary exam normal breath sounds clear to auscultation       Cardiovascular Exercise Tolerance: Good Normal cardiovascular exam Rhythm:Regular Rate:Normal  PVCs   Neuro/Psych Chronic lower back pain; spina bifida occulta    GI/Hepatic ,GERD  ,,  Endo/Other  Class 3 obesity  Renal/GU Renal disease (nephrolithiasis)     Musculoskeletal   Abdominal   Peds  Hematology negative hematology ROS (+)   Anesthesia Other Findings   Reproductive/Obstetrics                             Anesthesia Physical Anesthesia Plan  ASA: 3  Anesthesia Plan: General   Post-op Pain Management:    Induction: Intravenous  PONV Risk Score and Plan: 2 and Ondansetron, Dexamethasone and Treatment may vary due to age or medical condition  Airway Management Planned: Oral ETT  Additional Equipment:   Intra-op Plan:   Post-operative Plan: Extubation in OR  Informed Consent: I have reviewed the patients History and Physical, chart, labs and discussed the procedure including the risks, benefits and alternatives for the proposed anesthesia with the patient or authorized representative who has indicated his/her understanding and acceptance.     Dental advisory given  Plan Discussed with: CRNA  Anesthesia Plan Comments: (Patient consented for risks of anesthesia including but not limited to:  - adverse reactions to medications - damage to eyes,  teeth, lips or other oral mucosa - nerve damage due to positioning  - sore throat or hoarseness - damage to heart, brain, nerves, lungs, other parts of body or loss of life  Informed patient about role of CRNA in peri- and intra-operative care.  Patient voiced understanding.)       Anesthesia Quick Evaluation

## 2022-08-20 ENCOUNTER — Encounter: Payer: Self-pay | Admitting: Surgery

## 2022-08-30 ENCOUNTER — Encounter: Payer: Self-pay | Admitting: Surgery

## 2022-11-19 ENCOUNTER — Ambulatory Visit (INDEPENDENT_AMBULATORY_CARE_PROVIDER_SITE_OTHER): Payer: No Typology Code available for payment source | Admitting: Internal Medicine

## 2022-11-19 ENCOUNTER — Encounter: Payer: Self-pay | Admitting: Internal Medicine

## 2022-11-19 VITALS — BP 110/72 | HR 70 | Temp 97.9°F | Ht 70.0 in | Wt 330.0 lb

## 2022-11-19 DIAGNOSIS — Z0000 Encounter for general adult medical examination without abnormal findings: Secondary | ICD-10-CM | POA: Diagnosis not present

## 2022-11-19 DIAGNOSIS — Z6841 Body Mass Index (BMI) 40.0 and over, adult: Secondary | ICD-10-CM | POA: Diagnosis not present

## 2022-11-19 DIAGNOSIS — G4733 Obstructive sleep apnea (adult) (pediatric): Secondary | ICD-10-CM | POA: Diagnosis not present

## 2022-11-19 NOTE — Progress Notes (Signed)
Subjective:    Patient ID: Elijah Logan, male    DOB: Apr 16, 1981, 42 y.o.   MRN: [id number]  HPI Here for physical  Having some numbness in fingers Especially notes it when neck muscles are tense Feels it in the middle of his palm and 2nd and 3rd fingertips (feels tension in the middle of his hands) Went to [employer] helped for a while Notes it after work--especially if he is not looking up at his computer screens  Did have the umbilical hernia repair May need to go back for further scanning----felt pop day after surgery due to a coughing spell   Did have repeat colonoscopy  Current Outpatient Medications on File Prior to Visit  Medication Sig Dispense Refill   famotidine (PEPCID) 20 MG tablet Take 20 mg by mouth every morning.     fluticasone (FLONASE) 50 MCG/ACT nasal spray Place 2 sprays into both nostrils as needed.      loratadine (CLARITIN) 10 MG tablet Take 10 mg by mouth every morning.     Multiple Vitamins-Minerals (MULTIVITAMIN WITH MINERALS) tablet Take 1 tablet by mouth daily.     TURMERIC PO Take 1 tablet by mouth daily at 6 (six) AM.     No current facility-administered medications on file prior to visit.    No Known Allergies  Past Medical History:  Diagnosis Date   Allergic rhinitis due to pollen    mild   Bulging lumbar disc    Chronic low back pain    Has spina bifida occulta   GERD (gastroesophageal reflux disease)    Heart palpitations    PVCs noted on Event Monitor - some in Bigeminy or couplets.   History of kidney stones    Hyperlipidemia    Migraine    Moderate obesity    OSA on CPAP    PVC (premature ventricular contraction)     Past Surgical History:  Procedure Laterality Date   COLONOSCOPY WITH PROPOFOL N/A 06/02/2021   Procedure: COLONOSCOPY WITH PROPOFOL;  Surgeon: Regis Bill, MD;  Location: [facility];  Service: Endoscopy;  Laterality: N/A;   COLONOSCOPY WITH PROPOFOL N/A 08/09/2022   Procedure: COLONOSCOPY WITH  PROPOFOL;  Surgeon: Regis Bill, MD;  Location: [facility];  Service: Endoscopy;  Laterality: N/A;  PREFERS AM   ESOPHAGOGASTRODUODENOSCOPY (EGD) WITH PROPOFOL N/A 06/02/2021   Procedure: ESOPHAGOGASTRODUODENOSCOPY (EGD) WITH PROPOFOL;  Surgeon: Regis Bill, MD;  Location: [facility];  Service: Endoscopy;  Laterality: N/A;   OPEN ANTERIOR SHOULDER RECONSTRUCTION  05/03/1996   Right   TRANSTHORACIC ECHOCARDIOGRAM  12/28/2013   EF 55-60%.  Normal diastolic function   TREADMILL STRESS ECHO  01/02/2014   Ex 10:31 Min; 12.5 METS --> no EKG changes, Normal pre & post Echo Wall Motion. -- No ischemia or infarction   UMBILICAL HERNIA REPAIR N/A 08/19/2022   Procedure: HERNIA REPAIR UMBILICAL ADULT;  Surgeon: Sung Amabile, DO;  Location: [facility];  Service: General;  Laterality: N/A;   WISDOM TOOTH EXTRACTION      Family History  Problem Relation Age of Onset   Diabetes Mother        borderline   Diabetes Father        borderline   Heart disease Father        MI and needed defibrillation; PCI x3   Heart attack Father    Hypertension Other    Stroke Other    Cancer Other        prostate cancer   Diabetes  Maternal Grandmother    Diabetes Paternal Grandmother    Prostate cancer Other    Kidney cancer Neg Hx    Bladder Cancer Neg Hx     Social History   Socioeconomic History   Marital status: Married    Spouse name: Not on file   Number of children: 2   Years of education: Not on file   Highest education level: Not on file  Occupational History   Occupation: IT trainer    Comment: [employer]---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 His wife is a Publishing rights manager at [employer] Strain: Not on file  Food Insecurity: Not on file  Transportation Needs: Not on file  Physical Activity: Not on file  Stress: Not on file  Social Connections: Not on file  Intimate Partner Violence: Not on file   Review of Systems  Constitutional:        Wears seat belt Is now on weight loss routine (did just gain from vacation)  HENT:  Negative for dental problem, hearing loss and trouble swallowing.        Keeps up with dentist  Eyes:  Negative for visual disturbance.       No diplopia or unilateral vision loss Frustrated by reduced distance vision after a day at the computer  Respiratory:  Negative for cough, chest tightness and shortness of breath.        Had persistent post viral cough for 3 months--now better  Cardiovascular:  Negative for chest pain, palpitations and leg swelling.  Gastrointestinal:  Negative for blood in stool and constipation.       Some heartburn--better with improved diet---uses famotidine daily  Endocrine: Negative for polydipsia and polyuria.  Genitourinary:  Negative for difficulty urinating and urgency.       No sexual problems  Musculoskeletal:  Negative for joint swelling.       Tight neck and back  Skin:  Negative for rash.  Allergic/Immunologic: Negative for environmental allergies and immunocompromised state.  Neurological:  Negative for dizziness, syncope and light-headedness.       Some muscle tension headaches---uses heat/inversion table  Hematological:  Negative for adenopathy. Does not bruise/bleed easily.  Psychiatric/Behavioral:  Negative for sleep disturbance.        CPAP nightly       Objective:   Physical Exam Constitutional:      Appearance: Normal appearance. He is obese.  HENT:     Mouth/Throat:     Pharynx: No oropharyngeal exudate or posterior  oropharyngeal erythema.  Eyes:     Conjunctiva/sclera: Conjunctivae normal.     Pupils: Pupils are equal, round, and reactive to light.  Cardiovascular:     Rate and Rhythm: Normal rate and regular rhythm.     Pulses: Normal pulses.     Heart sounds: No murmur heard.    No gallop.  Pulmonary:     Effort: Pulmonary effort is normal.  Breath sounds: Normal breath sounds. No wheezing or rales.  Abdominal:     Palpations: Abdomen is soft.     Tenderness: There is no abdominal tenderness.  Musculoskeletal:     Cervical back: Neck supple.     Right lower leg: No edema.     Left lower leg: No edema.  Lymphadenopathy:     Cervical: No cervical adenopathy.  Skin:    Findings: No lesion or rash.  Neurological:     General: No focal deficit present.     Mental Status: He is alert and oriented to person, place, and time.  Psychiatric:        Mood and Affect: Mood normal.        Behavior: Behavior normal.            Assessment & Plan:

## 2022-11-19 NOTE — Assessment & Plan Note (Signed)
Will recheck some labs

## 2022-11-19 NOTE — Assessment & Plan Note (Signed)
Healthy--and working on lifestyle  Prefers no vaccines---will get Td if any injury Colon due again in 3 years

## 2022-11-19 NOTE — Assessment & Plan Note (Signed)
Does well with nightly CPAP

## 2022-11-20 LAB — VITAMIN B12: Vitamin B-12: 341 pg/mL (ref 200–1100)

## 2022-11-20 LAB — RENAL FUNCTION PANEL
Albumin: 4.6 g/dL (ref 3.6–5.1)
BUN: 15 mg/dL (ref 7–25)
CO2: 25 mmol/L (ref 20–32)
Calcium: 9.4 mg/dL (ref 8.6–10.3)
Chloride: 105 mmol/L (ref 98–110)
Creat: 0.96 mg/dL (ref 0.60–1.29)
Glucose, Bld: 82 mg/dL (ref 65–99)
Phosphorus: 3.3 mg/dL (ref 2.5–4.5)
Potassium: 4.3 mmol/L (ref 3.5–5.3)
Sodium: 140 mmol/L (ref 135–146)

## 2022-11-20 LAB — TSH: TSH: 1.17 mIU/L (ref 0.40–4.50)

## 2022-11-20 LAB — HEMOGLOBIN A1C
Hgb A1c MFr Bld: 5.6 % of total Hgb (ref <5.7)
Mean Plasma Glucose: 114 mg/dL
eAG (mmol/L): 6.3 mmol/L

## 2023-09-06 ENCOUNTER — Ambulatory Visit
Admission: RE | Admit: 2023-09-06 | Discharge: 2023-09-06 | Disposition: A | Discharge status: Home / Self Care | Source: Ambulatory Visit

## 2023-09-06 ENCOUNTER — Other Ambulatory Visit: Payer: Self-pay

## 2023-09-06 DIAGNOSIS — R1032 Left lower quadrant pain: Secondary | ICD-10-CM

## 2023-09-15 ENCOUNTER — Other Ambulatory Visit: Payer: Self-pay

## 2023-09-15 DIAGNOSIS — I700 Atherosclerosis of aorta: Secondary | ICD-10-CM

## 2023-09-29 ENCOUNTER — Ambulatory Visit
Admission: RE | Admit: 2023-09-29 | Discharge: 2023-09-29 | Disposition: A | Discharge status: Home / Self Care | Source: Ambulatory Visit

## 2023-09-29 DIAGNOSIS — I700 Atherosclerosis of aorta: Secondary | ICD-10-CM

## 2023-11-25 ENCOUNTER — Ambulatory Visit (INDEPENDENT_AMBULATORY_CARE_PROVIDER_SITE_OTHER): Payer: Self-pay | Admitting: Internal Medicine

## 2023-11-25 ENCOUNTER — Encounter: Payer: Self-pay | Admitting: Internal Medicine

## 2023-11-25 VITALS — BP 118/82 | HR 81 | Temp 98.1°F | Ht 70.5 in | Wt 344.0 lb

## 2023-11-25 DIAGNOSIS — K219 Gastro-esophageal reflux disease without esophagitis: Secondary | ICD-10-CM

## 2023-11-25 DIAGNOSIS — M1000 Idiopathic gout, unspecified site: Secondary | ICD-10-CM | POA: Diagnosis not present

## 2023-11-25 DIAGNOSIS — Z0000 Encounter for general adult medical examination without abnormal findings: Secondary | ICD-10-CM

## 2023-11-25 DIAGNOSIS — Z114 Encounter for screening for human immunodeficiency virus [HIV]: Secondary | ICD-10-CM

## 2023-11-25 DIAGNOSIS — Z1159 Encounter for screening for other viral diseases: Secondary | ICD-10-CM | POA: Diagnosis not present

## 2023-11-25 DIAGNOSIS — M109 Gout, unspecified: Secondary | ICD-10-CM | POA: Insufficient documentation

## 2023-11-25 LAB — COMPREHENSIVE METABOLIC PANEL WITH GFR
ALT: 30 U/L (ref 0–53)
AST: 15 U/L (ref 0–37)
Albumin: 4.4 g/dL (ref 3.5–5.2)
Alkaline Phosphatase: 58 U/L (ref 39–117)
BUN: 14 mg/dL (ref 6–23)
CO2: 29 meq/L (ref 19–32)
Calcium: 9.2 mg/dL (ref 8.4–10.5)
Chloride: 104 meq/L (ref 96–112)
Creatinine, Ser: 0.86 mg/dL (ref 0.40–1.50)
GFR: 106.68 mL/min (ref >60.00)
Glucose, Bld: 106 mg/dL — ABNORMAL HIGH (ref 70–99)
Potassium: 4.4 meq/L (ref 3.5–5.1)
Sodium: 141 meq/L (ref 135–145)
Total Bilirubin: 0.4 mg/dL (ref 0.2–1.2)
Total Protein: 6.9 g/dL (ref 6.0–8.3)

## 2023-11-25 LAB — CBC
HCT: 44.5 % (ref 39.0–52.0)
Hemoglobin: 14.6 g/dL (ref 13.0–17.0)
MCHC: 32.9 g/dL (ref 30.0–36.0)
MCV: 87.1 fl (ref 78.0–100.0)
Platelets: 200 K/uL (ref 150.0–400.0)
RBC: 5.1 Mil/uL (ref 4.22–5.81)
RDW: 13.6 % (ref 11.5–15.5)
WBC: 5.8 K/uL (ref 4.0–10.5)

## 2023-11-25 LAB — LIPID PANEL
Cholesterol: 166 mg/dL (ref 0–200)
HDL: 41.1 mg/dL (ref >39.00)
LDL Cholesterol: 105 mg/dL — ABNORMAL HIGH (ref 0–99)
NonHDL: 125.35
Total CHOL/HDL Ratio: 4
Triglycerides: 101 mg/dL (ref 0.0–149.0)
VLDL: 20.2 mg/dL (ref 0.0–40.0)

## 2023-11-25 LAB — VITAMIN B12: Vitamin B-12: 300 pg/mL (ref 211–911)

## 2023-11-25 LAB — TSH: TSH: 1.06 u[IU]/mL (ref 0.35–5.50)

## 2023-11-25 LAB — URIC ACID: Uric Acid, Serum: 7.3 mg/dL (ref 4.0–7.8)

## 2023-11-25 MED ORDER — COLCHICINE 0.6 MG PO TABS: 0.6000 mg | ORAL_TABLET | Freq: Two times a day (BID) | ORAL | 1 refills | Status: AC | PRN

## 2023-11-25 NOTE — Assessment & Plan Note (Signed)
 Worsened with stress Should increase the famotidine to twice a day--and change to omeprazole  if not better soon

## 2023-11-25 NOTE — Assessment & Plan Note (Signed)
 Healthy but obese Does work on lifestyle--- discussed low carb [facility]) Colon due again in 2 years Recommended flu vaccine in the fall Due for Td

## 2023-11-25 NOTE — Progress Notes (Signed)
 Subjective:    Patient ID: Elijah Logan, male    DOB: 1980/05/05, 43 y.o.   MRN: [id number]  HPI Here for physical  Worsening acid reflux--usually pepcid manages it Stress with regulatory projects at work Only taking it once a day Does have a sense of a knot' in upper chest (but not often)  Recent diverticulitis Then had cardiac calcium scoring---~50%  Walking 2-3 times a week Yard work weekly Weight still up some  Recent gout attack when travelling Thinks it was mostly due to getting dehydrated Uric acid flush helps and NSAIDs  Current Outpatient Medications on File Prior to Visit  Medication Sig Dispense Refill   famotidine (PEPCID) 20 MG tablet Take 20 mg by mouth every morning.     fluticasone (FLONASE) 50 MCG/ACT nasal spray Place 2 sprays into both nostrils as needed.      loratadine (CLARITIN) 10 MG tablet Take 10 mg by mouth every morning.     Multiple Vitamins-Minerals (MULTIVITAMIN WITH MINERALS) tablet Take 1 tablet by mouth daily.     TURMERIC PO Take 1 tablet by mouth daily at 6 (six) AM.     No current facility-administered medications on file prior to visit.    No Known Allergies  Past Medical History:  Diagnosis Date   Allergic rhinitis due to pollen    mild   Bulging lumbar disc    Chronic low back pain    Has spina bifida occulta   GERD (gastroesophageal reflux disease)    Heart palpitations    PVCs noted on Event Monitor - some in Bigeminy or couplets.   History of kidney stones    Hyperlipidemia    Migraine    Moderate obesity    OSA on CPAP    PVC (premature ventricular contraction)     Past Surgical History:  Procedure Laterality Date   COLONOSCOPY WITH PROPOFOL  N/A 06/02/2021   Procedure: COLONOSCOPY WITH PROPOFOL ;  Surgeon: Maryruth Ole DASEN, MD;  Location: [facility];  Service: Endoscopy;  Laterality: N/A;   COLONOSCOPY WITH PROPOFOL  N/A 08/09/2022   Procedure: COLONOSCOPY WITH PROPOFOL ;  Surgeon: Maryruth Ole DASEN, MD;   Location: [facility];  Service: Endoscopy;  Laterality: N/A;  PREFERS AM   ESOPHAGOGASTRODUODENOSCOPY (EGD) WITH PROPOFOL  N/A 06/02/2021   Procedure: ESOPHAGOGASTRODUODENOSCOPY (EGD) WITH PROPOFOL ;  Surgeon: Maryruth Ole DASEN, MD;  Location: [facility];  Service: Endoscopy;  Laterality: N/A;   OPEN ANTERIOR SHOULDER RECONSTRUCTION  05/03/1996   Right   TRANSTHORACIC ECHOCARDIOGRAM  12/28/2013   EF 55-60%.  Normal diastolic function   TREADMILL STRESS ECHO  01/02/2014   Ex 10:31 Min; 12.5 METS --> no EKG changes, Normal pre & post Echo Wall Motion. -- No ischemia or infarction   UMBILICAL HERNIA REPAIR N/A 08/19/2022   Procedure: HERNIA REPAIR UMBILICAL ADULT;  Surgeon: Tye Millet, DO;  Location: [facility];  Service: General;  Laterality: N/A;   WISDOM TOOTH EXTRACTION      Family History  Problem Relation Age of Onset   Diabetes Mother        borderline   Diabetes Father        borderline   Heart disease Father        MI and needed defibrillation; PCI x3   Heart attack Father    Hypertension Other    Stroke Other    Cancer Other        prostate cancer   Diabetes Maternal Grandmother    Diabetes Paternal Grandmother    Prostate cancer  Other    Kidney cancer Neg Hx    Bladder Cancer Neg Hx     Social History   Socioeconomic History   Marital status: Married    Spouse name: Not on file   Number of children: 2   Years of education: Not on file   Highest education level: Not on file  Occupational History   Occupation: IT trainer    Comment: [employer]---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 His wife is a Publishing rights manager at [employer] Strain: Not on file  Food Insecurity: Not on file  Transportation Needs: Not on file  Physical Activity: Not on file  Stress: Not on file  Social Connections: Not on file  Intimate Partner Violence: Not on file   Review of Systems  Constitutional:  Negative for fatigue.       Wears seat belt  HENT:  Negative for dental problem, hearing loss and tinnitus.        Keeps up with dentist  Eyes:        Some change in vision--distance. Especially after being on computer all day  Respiratory:  Negative for chest tightness and shortness of breath.        Some cough with reflux  Cardiovascular:  Negative for chest pain and leg swelling.       Rare palpitations---better off coffee  Gastrointestinal:  Negative for blood in stool and constipation.  Endocrine: Negative for polydipsia and polyuria.  Genitourinary:  Negative for difficulty urinating and urgency.       No sexual problems  Musculoskeletal:  Positive for arthralgias. Negative for joint swelling.  Skin:  Negative for rash.  Allergic/Immunologic: Positive for environmental allergies. Negative for immunocompromised state.  Neurological:  Negative for dizziness, syncope and light-headedness.       Stress headaches  Hematological:  Negative for adenopathy. Does not bruise/bleed easily.  Psychiatric/Behavioral:  Negative for dysphoric mood. The patient is not nervous/anxious.        Gets mind racing at night--but does fine once he falls asleep       Objective:   Physical Exam Constitutional:      Appearance: Normal appearance. He is obese.  HENT:     Mouth/Throat:     Pharynx: No oropharyngeal exudate or posterior oropharyngeal erythema.  Eyes:     Conjunctiva/sclera: Conjunctivae normal.     Pupils: Pupils are equal, round, and reactive to light.  Cardiovascular:     Rate and Rhythm: Normal rate and regular  rhythm.     Pulses: Normal pulses.     Heart sounds: No murmur heard.    No gallop.  Pulmonary:     Effort: Pulmonary effort is normal.     Breath sounds: Normal breath sounds. No wheezing or rales.  Abdominal:     Palpations: Abdomen is soft.     Tenderness: There is no abdominal tenderness.  Musculoskeletal:     Cervical back: Neck  supple.     Right lower leg: No edema.     Left lower leg: No edema.  Lymphadenopathy:     Cervical: No cervical adenopathy.  Skin:    Findings: No lesion or rash.  Neurological:     General: No focal deficit present.     Mental Status: He is alert and oriented to person, place, and time.  Psychiatric:        Mood and Affect: Mood normal.        Behavior: Behavior normal.            Assessment & Plan:

## 2023-11-25 NOTE — Patient Instructions (Addendum)
 Please look into the [facility] Diet  Low-Purine Eating Plan A low-purine eating plan involves making food choices to limit your purine intake. Purine is a kind of uric acid. Too much uric acid in your blood can cause certain conditions, such as gout and kidney stones. Eating a low-purine diet may help control these conditions. What are tips for following this plan? Shopping Avoid buying products that contain high-fructose corn syrup. Check for this on food labels. It is commonly found in many processed foods and soft drinks. Be sure to check for it in baked goods such as cookies, canned fruits, and cereals and cereal bars. Avoid buying veal, chicken breast with skin, lamb, and organ meats such as liver. These types of meats tend to have the highest purine content. Choose dairy products. These may lower uric acid levels. Avoid certain types of fish. Not all fish and seafood have high purine content. Examples with high purine content include anchovies, trout, tuna, sardines, and salmon. Avoid buying beverages that contain alcohol, particularly beer and hard liquor. Alcohol can affect the way your body gets rid of uric acid. Meal planning  Learn which foods do or do not affect you. If you find out that a food tends to cause your gout symptoms to flare up, avoid eating that food. You can enjoy foods that do not cause problems. If you have any questions about a food item, talk with your dietitian or health care provider. Reduce the overall amount of meat in your diet. When you do eat meat, choose ones with lower purine content. Include plenty of fruits and vegetables. Although some vegetables may have a high purine content--such as asparagus, mushrooms, spinach, or cauliflower--it has been shown that these do not contribute to uric acid blood levels as much. Consume at least 1 dairy serving a day. This has been shown to decrease uric acid levels. General information If you drink alcohol: Limit how  much you have to: 0-1 drink a day for women who are not pregnant. 0-2 drinks a day for men. Know how much alcohol is in a drink. In the [place] one drink equals one 12 oz bottle of beer (355 mL), one 5 oz glass of wine (148 mL), or one 1 oz glass of hard liquor (44 mL). Drink plenty of water. Try to drink enough to keep your urine pale yellow. Fluids can help remove uric acid from your body. Work with your health care provider and dietitian to develop a plan to achieve or maintain a healthy weight. Losing weight may help reduce uric acid in your blood. What foods are recommended? The following are some types of foods that are good choices when limiting purine intake: Fresh or frozen fruits and vegetables. Whole grains, breads, cereals, and pasta. Rice. Beans, peas, legumes. Nuts and seeds. Dairy products. Fats and oils. The items listed above may not be a complete list. Talk with a dietitian about what dietary choices are best for you. What foods are not recommended? Limit your intake of foods high in purines, including: Beer and other alcohol. Meat-based gravy or sauce. Canned or fresh fish, such as: Anchovies, sardines, herring, salmon, and tuna. Mussels and scallops. Codfish, trout, and haddock. Bacon, veal, chicken breast with skin, and lamb. Organ meats, such as: Liver or kidney. Tripe. Sweetbreads (thymus gland or pancreas). Wild Education officer, environmental. Yeast or yeast extract supplements. Drinks sweetened with high-fructose corn syrup, such as soda. Processed foods made with high-fructose corn syrup. The items listed above  may not be a complete list of foods and beverages you should limit. Contact a dietitian for more information. Summary Eating a low-purine diet may help control conditions caused by too much uric acid in the body, such as gout or kidney stones. Choose low-purine foods, limit alcohol, and limit high-fructose corn syrup. You will learn over time which foods do or do  not affect you. If you find out that a food tends to cause your gout symptoms to flare up, avoid eating that food. This information is not intended to replace advice given to you by your health care provider. Make sure you discuss any questions you have with your health care provider. Document Revised: 04/02/2021 Document Reviewed: 04/02/2021 Elsevier Patient Education  2025 [employer].

## 2023-11-25 NOTE — Assessment & Plan Note (Signed)
 Discussed [employer] Regular exercise

## 2023-11-25 NOTE — Assessment & Plan Note (Signed)
 New diagnosis  Will check labs Keeps hydrated--will Rx colchicine for prn use

## 2023-11-26 ENCOUNTER — Ambulatory Visit: Payer: Self-pay | Admitting: Internal Medicine

## 2023-11-26 LAB — HIV ANTIBODY (ROUTINE TESTING W REFLEX): HIV 1&2 Ab, 4th Generation: NONREACTIVE

## 2023-11-26 LAB — HEPATITIS C ANTIBODY: Hepatitis C Ab: NONREACTIVE

## 2024-11-30 ENCOUNTER — Encounter: Admitting: Nurse Practitioner
